# Patient Record
Sex: Male | Born: 1950 | Race: White | Hispanic: No | Marital: Married | Smoking: Former smoker
Health system: Southern US, Community
[De-identification: ages and names within clinical notes are randomized; demographics above are authoritative.]

## PROBLEM LIST (undated history)

## (undated) DIAGNOSIS — I10 Essential (primary) hypertension: Secondary | ICD-10-CM

---

## 1997-06-14 ENCOUNTER — Emergency Department (HOSPITAL_COMMUNITY): Admission: EM | Admit: 1997-06-14 | Discharge: 1997-06-14 | Payer: Self-pay | Admitting: Emergency Medicine

## 1997-06-15 ENCOUNTER — Ambulatory Visit (HOSPITAL_COMMUNITY): Admission: RE | Admit: 1997-06-15 | Discharge: 1997-06-15 | Payer: Self-pay | Admitting: Emergency Medicine

## 1997-07-10 ENCOUNTER — Ambulatory Visit (HOSPITAL_COMMUNITY): Admission: RE | Admit: 1997-07-10 | Discharge: 1997-07-10 | Payer: Self-pay | Admitting: Gastroenterology

## 2004-11-10 ENCOUNTER — Emergency Department (HOSPITAL_COMMUNITY): Admission: EM | Admit: 2004-11-10 | Discharge: 2004-11-10 | Payer: Self-pay | Admitting: Emergency Medicine

## 2007-06-16 ENCOUNTER — Encounter: Admission: RE | Admit: 2007-06-16 | Discharge: 2007-06-16 | Payer: Self-pay | Admitting: Internal Medicine

## 2008-03-15 ENCOUNTER — Encounter: Admission: RE | Admit: 2008-03-15 | Discharge: 2008-03-15 | Payer: Self-pay | Admitting: Internal Medicine

## 2008-03-20 ENCOUNTER — Encounter: Admission: RE | Admit: 2008-03-20 | Discharge: 2008-03-20 | Payer: Self-pay | Admitting: Internal Medicine

## 2008-07-23 ENCOUNTER — Encounter: Admission: RE | Admit: 2008-07-23 | Discharge: 2008-07-23 | Payer: Self-pay | Admitting: Internal Medicine

## 2009-11-15 ENCOUNTER — Encounter: Admission: RE | Admit: 2009-11-15 | Discharge: 2009-11-15 | Payer: Self-pay | Admitting: Internal Medicine

## 2010-02-03 ENCOUNTER — Encounter: Payer: Self-pay | Admitting: Internal Medicine

## 2010-11-05 ENCOUNTER — Other Ambulatory Visit: Payer: Self-pay | Admitting: Internal Medicine

## 2010-11-05 DIAGNOSIS — R911 Solitary pulmonary nodule: Secondary | ICD-10-CM

## 2010-11-12 ENCOUNTER — Other Ambulatory Visit: Payer: Self-pay

## 2010-12-08 ENCOUNTER — Inpatient Hospital Stay
Admission: RE | Admit: 2010-12-08 | Discharge: 2010-12-08 | Payer: Self-pay | Source: Ambulatory Visit | Attending: Internal Medicine | Admitting: Internal Medicine

## 2011-08-24 ENCOUNTER — Other Ambulatory Visit: Payer: Self-pay | Admitting: Cardiology

## 2011-08-24 ENCOUNTER — Ambulatory Visit
Admission: RE | Admit: 2011-08-24 | Discharge: 2011-08-24 | Disposition: A | Discharge status: Home / Self Care | Payer: BC Managed Care – PPO | Source: Ambulatory Visit | Attending: Cardiology | Admitting: Cardiology

## 2011-08-24 DIAGNOSIS — Z0000 Encounter for general adult medical examination without abnormal findings: Secondary | ICD-10-CM

## 2015-05-10 ENCOUNTER — Other Ambulatory Visit: Payer: Self-pay | Admitting: Internal Medicine

## 2015-05-10 DIAGNOSIS — Z139 Encounter for screening, unspecified: Secondary | ICD-10-CM

## 2015-05-24 ENCOUNTER — Ambulatory Visit
Admission: RE | Admit: 2015-05-24 | Discharge: 2015-05-24 | Disposition: A | Discharge status: Home / Self Care | Payer: Commercial Managed Care - PPO | Source: Ambulatory Visit | Attending: Internal Medicine | Admitting: Internal Medicine

## 2015-05-24 DIAGNOSIS — Z139 Encounter for screening, unspecified: Secondary | ICD-10-CM

## 2017-07-24 ENCOUNTER — Emergency Department (HOSPITAL_COMMUNITY)
Admission: EM | Admit: 2017-07-24 | Discharge: 2017-07-24 | Disposition: A | Discharge status: Home / Self Care | Payer: Commercial Managed Care - PPO | Attending: Emergency Medicine | Admitting: Emergency Medicine

## 2017-07-24 ENCOUNTER — Encounter (HOSPITAL_COMMUNITY): Payer: Self-pay | Admitting: Emergency Medicine

## 2017-07-24 ENCOUNTER — Emergency Department (HOSPITAL_COMMUNITY): Payer: Commercial Managed Care - PPO

## 2017-07-24 ENCOUNTER — Other Ambulatory Visit: Payer: Self-pay

## 2017-07-24 DIAGNOSIS — R55 Syncope and collapse: Secondary | ICD-10-CM | POA: Diagnosis present

## 2017-07-24 DIAGNOSIS — I10 Essential (primary) hypertension: Secondary | ICD-10-CM | POA: Diagnosis not present

## 2017-07-24 HISTORY — DX: Essential (primary) hypertension: I10

## 2017-07-24 LAB — CBC
HCT: 43.9 % (ref 39.0–52.0)
Hemoglobin: 15 g/dL (ref 13.0–17.0)
MCH: 30.4 pg (ref 26.0–34.0)
MCHC: 34.2 g/dL (ref 30.0–36.0)
MCV: 89 fL (ref 78.0–100.0)
Platelets: 154 10*3/uL (ref 150–400)
RBC: 4.93 MIL/uL (ref 4.22–5.81)
RDW: 12.7 % (ref 11.5–15.5)
WBC: 6.8 10*3/uL (ref 4.0–10.5)

## 2017-07-24 LAB — BASIC METABOLIC PANEL
Anion gap: 9 (ref 5–15)
BUN: 17 mg/dL (ref 8–23)
CO2: 26 mmol/L (ref 22–32)
Calcium: 9.1 mg/dL (ref 8.9–10.3)
Chloride: 103 mmol/L (ref 98–111)
Creatinine, Ser: 1.1 mg/dL (ref 0.61–1.24)
GFR calc Af Amer: >60 mL/min (ref >60)
GFR calc non Af Amer: >60 mL/min (ref >60)
Glucose, Bld: 104 mg/dL — ABNORMAL HIGH (ref 70–99)
Potassium: 3.7 mmol/L (ref 3.5–5.1)
Sodium: 138 mmol/L (ref 135–145)

## 2017-07-24 LAB — CBG MONITORING, ED: Glucose-Capillary: 109 mg/dL — ABNORMAL HIGH (ref 70–99)

## 2017-07-24 NOTE — ED Notes (Signed)
Patient left at this time with all belongings. 

## 2017-07-24 NOTE — ED Notes (Signed)
ED Provider at bedside. 

## 2017-07-24 NOTE — Discharge Instructions (Addendum)
Hold your chlorthalidone until followed up by your primary doctor.  Rest and increase your fluid intake for the next 2 days.  Return to the emergency department if symptoms significantly worsen or change.

## 2017-07-24 NOTE — ED Triage Notes (Signed)
Pt had one episode of syncope, falling to the floor after he urinated.  Pt fell and hit the front of his face, wife stated it took him a minute or two to wake up and recognize her.  Recent change to bp medications.  Was feeling nausea however given 4mg  of Zofran by EMS which relieved the symptom.

## 2017-07-24 NOTE — ED Provider Notes (Signed)
[facility]   CSN: [id number] Arrival date & time: 07/24/17  0125     History   Chief Complaint Chief Complaint  Patient presents with  . Loss of Consciousness    HPI Ian Powell is a 67 y.o. male.  This patient is a 67 year old male with past medical history of hypertension.  He presents today for evaluation of a syncopal episode.  This evening, he woke to go to the bathroom.  While he was standing and attempting to urinate, he suddenly became very dizzy, lightheaded, and broke out in a sweat.  He then took several steps back toward the bedroom to ask his wife for help when he then fell to the floor and had a brief episode of unresponsiveness.  He woke up less than a minute later confused and somewhat disoriented.  There was no seizure-like activity and no loss of bowel or bladder function.  He now feels fine.  He was restarted on his blood pressure medicine chlorthalidone approximately 1 week ago and reports feeling somewhat dizzy since starting this medication.  He spoke with his primary doctor today who told him to discontinue the medication.  The history is provided by the patient.  Loss of Consciousness   This is a new problem. The current episode started less than 1 hour ago. The problem occurs constantly. The problem has been resolved. He lost consciousness for a period of less than one minute. Associated with: Urinating. Pertinent negatives include chest pain, fever, headaches and slurred speech. He has tried nothing for the symptoms. The treatment provided no relief.    Past Medical History:  Diagnosis Date  . Hypertension     There are no active problems to display for this patient.   History reviewed. No pertinent surgical history.      Home Medications    Prior to Admission medications   Not on File    Family History No family history on file.  Social History Social History   Tobacco Use  .  Smoking status: Not on file  Substance Use Topics  . Alcohol use: Not on file  . Drug use: Not on file     Allergies   Penicillins   Review of Systems Review of Systems  Constitutional: Negative for fever.  Cardiovascular: Positive for syncope. Negative for chest pain.  Neurological: Negative for headaches.  All other systems reviewed and are negative.    Physical Exam Updated Vital Signs BP 118/71 (BP Location: Right Arm)   Pulse 60   Temp 97.9 F (36.6 C) (Oral)   Resp 15   Ht 5\' 10"  (1.778 m)   Wt 89.8 kg (198 lb)   SpO2 96%   BMI 28.41 kg/m   Physical Exam  Constitutional: He is oriented to person, place, and time. He appears well-developed and well-nourished. No distress.  HENT:  Head: Normocephalic and atraumatic.  Mouth/Throat: Oropharynx is clear and moist.  Eyes: Pupils are equal, round, and reactive to light. EOM are normal.  Neck: Normal range of motion. Neck supple.  Cardiovascular: Normal rate and regular rhythm. Exam reveals no friction rub.  No murmur heard. Pulmonary/Chest: Effort normal and breath sounds normal. No respiratory distress. He has no wheezes. He has no rales.  Abdominal: Soft. Bowel sounds are normal. He exhibits no distension. There is no tenderness.  Musculoskeletal: Normal range of motion. He exhibits no edema.  Neurological: He is alert and oriented to person, place, and time. No  cranial nerve deficit. He exhibits normal muscle tone. Coordination normal.  Skin: Skin is warm and dry. He is not diaphoretic.  Nursing note and vitals reviewed.    ED Treatments / Results  Labs (all labs ordered are listed, but only abnormal results are displayed) Labs Reviewed  CBG MONITORING, ED - Abnormal; Notable for the following components:      Result Value   Glucose-Capillary 109 (*)    All other components within normal limits  CBC  BASIC METABOLIC PANEL  URINALYSIS, ROUTINE W REFLEX MICROSCOPIC    EKG EKG  Interpretation  Date/Time:  Saturday July 24 2017 01:53:36 EDT Ventricular Rate:  62 PR Interval:    QRS Duration: 91 QT Interval:  431 QTC Calculation: 438 R Axis:     Text Interpretation:  Sinus rhythm Nonspecific T wave abnormality Prolonged QT No signficant change from 11/10/2004 Confirmed by Geoffery Lyons ([id number]) on 07/24/2017 2:12:19 AM   Radiology No results found.  Procedures Procedures (including critical care time)  Medications Ordered in ED Medications - No data to display   Initial Impression / Assessment and Plan / ED Course  I have reviewed the triage vital signs and the nursing notes.  Pertinent labs & imaging results that were available during my care of the patient were reviewed by me and considered in my medical decision making (see chart for details).  Patient presents here after a syncopal episode that occurred while urinating.  He was also recently started on a new blood pressure medication.  I am uncertain as to the exact etiology of the syncope, however his work-up is unremarkable.  Based on the history, I suspect micturition induced.  At this point, I feel as though he is appropriate for discharge, to follow-up as needed.  Final Clinical Impressions(s) / ED Diagnoses   Final diagnoses:  None    ED Discharge Orders    None       Geoffery Lyons, MD 07/24/17 [phone]

## 2019-07-24 ENCOUNTER — Other Ambulatory Visit: Payer: Self-pay

## 2019-07-24 ENCOUNTER — Encounter (HOSPITAL_COMMUNITY): Payer: Self-pay | Admitting: Emergency Medicine

## 2019-07-24 ENCOUNTER — Emergency Department (HOSPITAL_COMMUNITY): Payer: Commercial Managed Care - PPO

## 2019-07-24 ENCOUNTER — Emergency Department (HOSPITAL_COMMUNITY)
Admission: EM | Admit: 2019-07-24 | Discharge: 2019-07-24 | Disposition: A | Discharge status: Home / Self Care | Payer: Commercial Managed Care - PPO | Attending: Emergency Medicine | Admitting: Emergency Medicine

## 2019-07-24 DIAGNOSIS — Z5321 Procedure and treatment not carried out due to patient leaving prior to being seen by health care provider: Secondary | ICD-10-CM | POA: Insufficient documentation

## 2019-07-24 DIAGNOSIS — R0789 Other chest pain: Secondary | ICD-10-CM | POA: Diagnosis not present

## 2019-07-24 LAB — BASIC METABOLIC PANEL
Anion gap: 11 (ref 5–15)
BUN: 14 mg/dL (ref 8–23)
CO2: 25 mmol/L (ref 22–32)
Calcium: 9.4 mg/dL (ref 8.9–10.3)
Chloride: 102 mmol/L (ref 98–111)
Creatinine, Ser: 1.02 mg/dL (ref 0.61–1.24)
GFR calc Af Amer: >60 mL/min (ref >60)
GFR calc non Af Amer: >60 mL/min (ref >60)
Glucose, Bld: 84 mg/dL (ref 70–99)
Potassium: 3.9 mmol/L (ref 3.5–5.1)
Sodium: 138 mmol/L (ref 135–145)

## 2019-07-24 LAB — CBC
HCT: 43.5 % (ref 39.0–52.0)
Hemoglobin: 14.6 g/dL (ref 13.0–17.0)
MCH: 30.5 pg (ref 26.0–34.0)
MCHC: 33.6 g/dL (ref 30.0–36.0)
MCV: 90.8 fL (ref 80.0–100.0)
Platelets: 195 K/uL (ref 150–400)
RBC: 4.79 MIL/uL (ref 4.22–5.81)
RDW: 13.1 % (ref 11.5–15.5)
WBC: 7.6 K/uL (ref 4.0–10.5)
nRBC: 0 % (ref 0.0–0.2)

## 2019-07-24 LAB — TROPONIN I (HIGH SENSITIVITY)
Troponin I (High Sensitivity): 4 ng/L
Troponin I (High Sensitivity): 4 ng/L

## 2019-07-24 MED ORDER — SODIUM CHLORIDE 0.9% FLUSH: 3.0000 mL | Freq: Once | INTRAVENOUS | Status: DC

## 2019-07-24 NOTE — ED Triage Notes (Addendum)
Pt c/o mid chest tightness for the past 3 days getting worse, pt denies SOB, nausea, vomiting or dizziness, pt states even walking his dog make the pain increase.

## 2019-11-10 ENCOUNTER — Ambulatory Visit: Payer: Commercial Managed Care - PPO | Admitting: Family Medicine

## 2019-11-10 ENCOUNTER — Encounter: Payer: Self-pay | Admitting: Family Medicine

## 2019-11-10 ENCOUNTER — Ambulatory Visit: Payer: Self-pay

## 2019-11-10 ENCOUNTER — Other Ambulatory Visit: Payer: Self-pay

## 2019-11-10 VITALS — BP 136/78 | HR 60 | Ht 70.0 in | Wt 187.0 lb

## 2019-11-10 DIAGNOSIS — M84351A Stress fracture, right femur, initial encounter for fracture: Secondary | ICD-10-CM | POA: Diagnosis not present

## 2019-11-10 DIAGNOSIS — M25561 Pain in right knee: Secondary | ICD-10-CM | POA: Diagnosis not present

## 2019-11-10 NOTE — Progress Notes (Signed)
Subjective:    CC: R ant knee pain  I, Molly Weber, LAT, ATC, am serving as scribe for Dr. [name].  HPI: Pt is a 69 y/o male presenting w/ c/o R ant knee pain x 3 weeks when he was running downhill and experienced sharp R knee pain.  He runs 4-5 miles/day and runs 4-5 days/week.  Developed pain after running downhill.  Seen at [facility] when he 2 weeks ago where x-ray showed reportedly some DJD and was given intra-articular steroid injection with little benefit.  Pain is worse at medial knee.  R knee swelling: no R knee mechanical symptoms: no Aggravating factors: running; transitioning from sit-to-stand; first few steps when he first stands from sitting; ascending stairs; walking downhill Treatments tried: cortisone injection 2 weeks ago ; ice; IBU  Diagnostic imaging: R knee XR at [facility] 2 weeks ago  Pertinent review of Systems: No fevers or chills  Relevant historical information: Hypertension   Objective:    Vitals:   11/10/19 0844  BP: 136/78  Pulse: 60  SpO2: 98%   General: Well Developed, well nourished, and in no acute distress.   MSK: Right knee normal-appearing Normal motion with minimal crepitation. Tender palpation significantly at medial femoral condyle just proximal of medial joint line. Stable ligamentous exam and negative McMurray's test somewhat limited by guarding. Intact strength.  Lab and Radiology Results Diagnostic Limited MSK Ultrasound of: Right knee Quad tendon intact normal-appearing Trace joint effusion superior patellar space. Patellar tendon intact normal. Trace hypoechoic fluid tracking superficial to patellar tendon subcutaneous space consistent with mild prepatellar bursitis. Lateral joint line normal. Medial joint line narrowed with degenerative appearing partially extruded medial meniscus. Area of maximum tenderness visualized at medial femoral condyle. Defect in bone cortex visible with increased vascular activity  consistent with stress fracture versus tiny cortical fracture Impression: Concern for femoral condyle fracture versus stress fracture and DJD, degenerative medial meniscus, prepatellar bursitis    Impression and Recommendations:    Assessment and Plan: 69 y.o. male with right knee pain occurring while running downhill about 3 weeks ago.Cathlean Sauer at outside location did not show fracture and patient did not have significant benefit from steroid injection.  Based on ultrasound examination today very concerned for femoral condyle fracture versus stress fracture.  Plan for knee brace, limited weightbearing with crutches, and MRI.  Recheck following MRI.  PDMP not reviewed this encounter. Orders Placed This Encounter  Procedures  . [place] LIMITED JOINT SPACE STRUCTURES LOW RIGHT(NO LINKED CHARGES)    Order Specific Question:   Reason for Exam (SYMPTOM  OR DIAGNOSIS REQUIRED)    Answer:   R knee pain    Order Specific Question:   Preferred imaging location?    Answer:   [facility]  . MR Knee Right Wo Contrast    Standing Status:   Future    Standing Expiration Date:   11/09/2020    Order Specific Question:   What is the patient's sedation requirement?    Answer:   No Sedation    Order Specific Question:   Does the patient have a pacemaker or implanted devices?    Answer:   No    Order Specific Question:   Preferred imaging location?    Answer:   [facility] (table limit-350lbs)   No orders of the defined types were placed in this encounter.   Discussed warning signs or symptoms. Please see discharge instructions. Patient expresses understanding.   The above  documentation has been reviewed and is accurate and complete Lynne Leader, M.D.

## 2019-11-10 NOTE — Patient Instructions (Addendum)
Thank you for coming in today.  Hinged knee brace.  Crutches with limited weight bearing.  Plan for MRI.  Recheck following MRI.     Stress Fracture  A stress fracture is a small break or crack in a bone. A stress fracture can be fully broken (complete) or partially broken (incomplete). The most common sites for stress fractures are the bones in the front of your feet (metatarsals), your heel (calcaneus), and the long bone of your lower leg (tibia). What are the causes? This condition is caused by overuse or repetitive exercise, such as running. It happens when a bone cannot absorb any more shock because the muscles around it are weak. Stress fractures happen most commonly when:  You rapidly increase or start a new physical activity.  You use shoes that are worn out or do not fit properly.  You exercise on a new surface. What increases the risk? You are more likely to develop this condition if:  You have a condition that causes weak bones (osteoporosis).  You are male. Stress fractures are more likely to occur in women. What are the signs or symptoms? The most common symptom of a stress fracture is feeling pain when you are using or putting weight on the affected part of your body. The pain usually improves when you are resting. Other symptoms may include:  Swelling of the affected area.  Pain in the area when it is touched. Stress fracture pain usually develops over time. How is this diagnosed? This condition may be diagnosed by:  Your symptoms.  Your medical history.  A physical exam.  Imaging tests, such as: ? X-rays. ? MRI. ? Bone scan. How is this treated? Treatment depends on the severity of your stress fracture. It is commonly treated with resting, icing, compression, and elevation (RICE therapy). Treatment may also include:  Medicines to reduce inflammation.  A cast or a walking shoe.  Crutches.  Surgery. This is usually only in severe cases. Follow  these instructions at home: If you have a cast:  Do not put pressure on any part of the cast until it is fully hardened. This may take several hours.  Do not stick anything inside the cast to scratch your skin. Doing that increases your risk of infection.  Check the skin around the cast every day. Tell your health care provider about any concerns.  You may put lotion on dry skin around the edges of the cast. Do not put lotion on the skin underneath the cast.  Keep the cast clean.  If the cast is not waterproof: ? Do not let it get wet. ? Cover it with a watertight covering when you take a bath or a shower. If you have a walking shoe:  Wear the shoe as told by your health care provider. Remove it only as told by your health care provider.  Loosen the shoe if your toes tingle, become numb, or turn cold and blue.  Keep the shoe clean.  If the shoe is not waterproof: ? Do not let it get wet. Managing pain, stiffness, and swelling   If directed, apply ice to the injured area: ? If you have a walking shoe, remove the shoe as told by your health care provider. ? Put ice in a plastic bag. ? Place a towel between your skin and the bag or between your cast and the bag. ? Leave the ice on for 20 minutes, 2-3 times per day.  Move your toes often to  avoid stiffness and to lessen swelling.  Raise (elevate) the injured area above the level of your heart while you are sitting or lying down. Activity  Rest as directed by your health care provider. Ask your health care provider if you may do alternative exercises, such as swimming or biking, while you are healing.  Return to your normal activities as directed by your health care provider. Ask your health care provider what activities are safe for you.  Perform range-of-motion exercises only as directed by your health care provider. General instructions  Do not use the injured limb to support yourbody weight until your health care  provider says that you can. Use crutches if your health care provider tells you to do so.  Do not use any products that contain nicotine or tobacco, such as cigarettes and e-cigarettes. These can delay bone healing. If you need help quitting, ask your health care provider.  Take over-the-counter and prescription medicines only as told by your health care provider.  Keep all follow-up visits as told by your health care provider. This is important. How is this prevented?  Only wear shoes that: ? Fit well. ? Are not worn out.  Eat a healthy diet that contains vitamin D and calcium. This helps keep your bones strong. Good sources of calcium and vitamin D include: ? Low-fat dairy products such as milk, yogurt, and cheese. ? Certain fish, such as fresh or canned salmon, tuna, and sardines. ? Products that have calcium and vitamin D added to them (fortified products), such as fortified cereals or juice.  Be careful when you start a new physical activity. Give your body time to adjust.  Avoid doing only one kind of activity. Do different exercises, such as swimming and running, so that no single part of your body gets overused.  Do strength-training exercises. Contact a health care provider if:  Your pain gets worse.  You have new symptoms.  You have increased swelling. Get help right away if:  You lose feeling in the injured area. Summary  A stress fracture is a small break or crack in a bone. A stress fracture can be fully broken (complete) or partially broken (incomplete).  This condition is caused by overuse or repetitive exercise, such as running.  The most common symptom of a stress fracture is feeling pain when you are using or putting weight on the affected part of your body.  Treatment depends on the severity of your stress fracture. This information is not intended to replace advice given to you by your health care provider. Make sure you discuss any questions you have  with your health care provider. Document Revised: 02/09/2017 Document Reviewed: 02/09/2017 Elsevier Patient Education  2020 [employer].

## 2019-11-19 ENCOUNTER — Other Ambulatory Visit: Payer: Self-pay

## 2019-11-19 ENCOUNTER — Ambulatory Visit (INDEPENDENT_AMBULATORY_CARE_PROVIDER_SITE_OTHER): Payer: Commercial Managed Care - PPO

## 2019-11-19 DIAGNOSIS — Y9302 Activity, running: Secondary | ICD-10-CM | POA: Diagnosis not present

## 2019-11-19 DIAGNOSIS — M84351A Stress fracture, right femur, initial encounter for fracture: Secondary | ICD-10-CM

## 2019-11-19 DIAGNOSIS — M25561 Pain in right knee: Secondary | ICD-10-CM

## 2019-11-20 NOTE — Progress Notes (Signed)
MRI does show a nondepressed nondisplaced insufficiency fracture of the femur at the area of pain on ultrasound.  It also shows a meniscus tear and some arthritis.  Continue current treatment as we discussed the other day in clinic and schedule follow-up appoint with me in the near future to go over the results of full detail.

## 2019-11-23 NOTE — Progress Notes (Signed)
I, Christoper Fabian, LAT, ATC, am serving as scribe for Dr. [name].  Ian Powell is a 69 y.o. male who presents to [facility] today for f/u of R medial knee / distal thigh pain and R knee MRI review.  He was last seen by Dr. Denyse Amass on 11/10/19 for a 2nd opinion after having been to [facility] and had a corticosteroid injection in his R knee w/ little benefit.  Due to concern for a possible stress fracture, pt was placed in a hinged knee brace and given crutches.  Since his last visit, pt reports that his R medial knee pain remains unchanged / slightly worse.  He doesn't seem to have much swelling per pt report.  Pt has been wearing his hinged knee brace and ambulating w/ one axillary crutch.  Diagnostic testing: R knee MRI- 11/19/19   Pertinent review of systems: No fevers or chills  Relevant historical information: No diagnosed history of osteoporosis or decreased bone mineral density   Exam:  BP 132/80 (BP Location: Left Arm, Patient Position: Sitting, Cuff Size: Normal)   Pulse 64   Ht 5\' 10"  (1.778 m)   Wt 186 lb 6.4 oz (84.6 kg)   SpO2 97%   BMI 26.75 kg/m  General: Well Developed, well nourished, and in no acute distress.   MSK: Right knee normal motion    Lab and Radiology Results EXAM: MRI OF THE RIGHT KNEE WITHOUT CONTRAST  TECHNIQUE: Multiplanar, multisequence MR imaging of the knee was performed. No intravenous contrast was administered.  COMPARISON:  None.  FINDINGS: MENISCI  Medial: Radial tear of the posterior horn of the medial meniscus extending into the posterior body.  Lateral: Intact.  LIGAMENTS  Cruciates: ACL and PCL are intact.  Collaterals: Medial collateral ligament is intact. Lateral collateral ligament complex is intact.  CARTILAGE  Patellofemoral: Partial-thickness cartilage loss of the patella with subchondral marrow edema in the patellar apex. Partial-thickness cartilage loss of the  trochlear groove.  Medial: High-grade partial-thickness cartilage loss of the medial femorotibial compartment with areas of full-thickness cartilage loss of the medial femoral condyle. Subchondral reactive marrow edema.  Lateral: Partial thickness cartilage loss of the lateral femorotibial compartment.  JOINT: Small joint effusion. Normal Hoffa's fat-pad. No plical thickening.  POPLITEAL FOSSA: Popliteus tendon is intact. Small Baker's cyst.  EXTENSOR MECHANISM: Intact quadriceps tendon. Intact patellar tendon. Intact lateral patellar retinaculum. Intact medial patellar retinaculum. Intact MPFL.  BONES: Subchondral linear low signal in the medial femoral condyle with severe surrounding marrow edema most concerning for a nondepressed, nondisplaced subchondral insufficiency fracture. Subchondral marrow edema in the periphery of the medial tibial plateau may reflect stress reaction versus reactive changes secondary to overlying cartilage loss and meniscal pathology.  Other: No fluid collection or hematoma. Muscles are normal.  IMPRESSION: 1. Radial tear of the posterior horn of the medial meniscus extending into the posterior body. 2. Nondepressed, nondisplaced subchondral insufficiency fracture of the medial femoral condyle with severe surrounding marrow edema. 3. Subchondral marrow edema in the periphery of the medial tibial plateau may reflect stress reaction versus reactive changes secondary to overlying cartilage loss and meniscal pathology. 4. Tricompartmental cartilage abnormalities as described above.   Electronically Signed   By:   On: 11/20/2019 08:40 I, 13/08/2019, personally (independently) visualized and performed the interpretation of the images attached in this note. Significant bone anemia medial femoral condyle and medial tibia plateau with insufficiency fracture without depression or displacement at medial  femoral condyle.  Posterior  horn medial meniscus tear as well.   Assessment and Plan: 69 y.o. male with knee pain secondary to insufficiency fracture of the medial femoral condyle.  This is thought to be due to excessive impacted due to some chondromalacia as well as posterior horn medial meniscus tear.  However we will double check to make sure he does not have significant bone marrow deficiency or osteoporosis or osteopenia by obtaining a DEXA scan. In the meantime recommend offloading.  Continue hinged knee brace and offloading to nonweightbearing or limited touchdown weightbearing.  Recheck in 1 month.  Will preserve quad strength by home exercise program including straight leg raises.  Recommend adding vitamin D   PDMP not reviewed this encounter. Orders Placed This Encounter  Procedures  . DG Bone Density    Standing Status:   Future    Standing Expiration Date:   11/23/2020    Order Specific Question:   Reason for Exam (SYMPTOM  OR DIAGNOSIS REQUIRED)    Answer:   insufficiency fracture    Order Specific Question:   Preferred imaging location?    Answer:   [address]   No orders of the defined types were placed in this encounter.    Discussed warning signs or symptoms. Please see discharge instructions. Patient expresses understanding.   The above documentation has been reviewed and is accurate and complete [name], M.D.  Total encounter time 30 minutes including face-to-face time with the patient and, reviewing past medical record, and charting on the date of service.   Discussed MRI findings and treatment plan options and next steps.  Also discussed return to running program.

## 2019-11-24 ENCOUNTER — Encounter: Payer: Self-pay | Admitting: Family Medicine

## 2019-11-24 ENCOUNTER — Other Ambulatory Visit: Payer: Self-pay

## 2019-11-24 ENCOUNTER — Ambulatory Visit: Payer: Commercial Managed Care - PPO | Admitting: Family Medicine

## 2019-11-24 ENCOUNTER — Ambulatory Visit (INDEPENDENT_AMBULATORY_CARE_PROVIDER_SITE_OTHER)
Admission: RE | Admit: 2019-11-24 | Discharge: 2019-11-24 | Disposition: A | Discharge status: Home / Self Care | Payer: Commercial Managed Care - PPO | Source: Ambulatory Visit | Attending: Family Medicine | Admitting: Family Medicine

## 2019-11-24 VITALS — BP 132/80 | HR 64 | Ht 70.0 in | Wt 186.4 lb

## 2019-11-24 DIAGNOSIS — M8580 Other specified disorders of bone density and structure, unspecified site: Secondary | ICD-10-CM

## 2019-11-24 DIAGNOSIS — M84451A Pathological fracture, right femur, initial encounter for fracture: Secondary | ICD-10-CM | POA: Diagnosis not present

## 2019-11-24 DIAGNOSIS — M859 Disorder of bone density and structure, unspecified: Secondary | ICD-10-CM

## 2019-11-24 NOTE — Patient Instructions (Signed)
Thank you for coming in today.  Plan for bone density test. You should hear soon about scheduling or the front desk can schedule.   Offload the knee. Use two crutches if needed to reduce pain further. Less pressure to no pressure on the knee if better for now.   Vit D3 1000-5000 units daily.   Work on straight leg raises to keep the quad strong.   Recheck in 1 month.

## 2019-11-27 ENCOUNTER — Encounter: Payer: Self-pay | Admitting: Family Medicine

## 2019-11-27 NOTE — Progress Notes (Signed)
Bone density test shows low bone density that puts you in the osteopenia range.  However with the history of insufficiency fracture that puts you more at osteoporosis range.  I will send your primary care provider a letter.  It is reasonable to discuss with your primary care provider possibility of starting medications for osteopenia or osteoporosis.

## 2019-12-12 ENCOUNTER — Telehealth: Payer: Self-pay | Admitting: Family Medicine

## 2019-12-12 NOTE — Telephone Encounter (Signed)
Received notes from reviewing orthopedic.  Patient had visit on October 18, 2019.  X-ray showed mild to moderate degenerative changes but no fracture.  He was given a Depo-Medrol Marcaine injection.  Note will be sent to scan.

## 2019-12-14 NOTE — Progress Notes (Signed)
   I, Christoper Fabian, LAT, ATC, am serving as scribe for Dr. [name].  Ian Powell is a 69 y.o. male who presents to [facility] at [facility] today for f/u of his R medial knee/distal thigh pain due to a medial femoral condyle insufficiency fracture.  He was last seen by Dr. Denyse Amass on 11/24/19 and noted no change/slight worsening and con't to wear his hinged knee brace as suggested.  Pt was advised to transition to NWB status and to use B axillary crutches instead of one crutch as he had been doing.  He was also referred for a bone density test.  Since his last visit, pt reports that his R medial knee is feeling much better and notes virtually no pain since his last visit.  He is not reporting any issue w/ R knee AROM.  He has been consistently doing his HEP for quad strengthening.  Diagnostic testing: Bone density- 11/24/19; R knee MRI- 11/19/19  Pertinent review of systems: No fevers or chills  Relevant historical information: Hypertension   Exam:  BP 118/78 (BP Location: Left Arm, Patient Position: Sitting, Cuff Size: Normal)   Pulse 62   Ht 5\' 10"  (1.778 m)   Wt 189 lb 6.4 oz (85.9 kg)   SpO2 98%   BMI 27.18 kg/m  General: Well Developed, well nourished, and in no acute distress.   MSK: Right knee normal-appearing nontender normal motion.      Assessment and Plan: 69 y.o. male with right knee medial femoral condyle insufficiency fracture.  Clinically doing great with reduced weightbearing.  Plan to gradually increase weightbearing as tolerated over the next 3 weeks.  Start working on quad strengthening more thoroughly at the gym in about 2 or 3 weeks.  Hopefully will be able to get rid of crutches completely by the 3-week mark.  Plan to check back in about a month or so.  Plan to return to running in about 6 weeks.  Anticipate return to running program.  Also reviewed bone density.  Patient has low bone mineral density determined via DEXA scan after his last  visit.  It is debatable whether or not his fracture is a fragility fracture or just bad luck.  If we determine true fragility fracture he would meet criteria for osteoporosis.  However I do not think this is likely.  Plan to continue vitamin D.    Discussed warning signs or symptoms. Please see discharge instructions. Patient expresses understanding.   The above documentation has been reviewed and is accurate and complete 66, M.D.

## 2019-12-15 ENCOUNTER — Ambulatory Visit: Payer: Commercial Managed Care - PPO | Admitting: Family Medicine

## 2019-12-15 ENCOUNTER — Encounter: Payer: Self-pay | Admitting: Family Medicine

## 2019-12-15 ENCOUNTER — Other Ambulatory Visit: Payer: Self-pay

## 2019-12-15 VITALS — BP 118/78 | HR 62 | Ht 70.0 in | Wt 189.4 lb

## 2019-12-15 DIAGNOSIS — M84451D Pathological fracture, right femur, subsequent encounter for fracture with routine healing: Secondary | ICD-10-CM

## 2019-12-15 DIAGNOSIS — M84453A Pathological fracture, unspecified femur, initial encounter for fracture: Secondary | ICD-10-CM | POA: Insufficient documentation

## 2019-12-15 DIAGNOSIS — M8580 Other specified disorders of bone density and structure, unspecified site: Secondary | ICD-10-CM | POA: Diagnosis not present

## 2019-12-15 HISTORY — DX: Other specified disorders of bone density and structure, unspecified site: M85.80

## 2019-12-15 NOTE — Patient Instructions (Signed)
Thank you for coming in today.  Advance weightbearing as tolerated over the next 3 weeks or so.   Recheck in about 1 month.   After 2 weeks ok to increase quad strength in gym as tolerated.   Plan to return to running in something like 6 weeks.

## 2020-01-18 NOTE — Progress Notes (Signed)
° °  I, Christoper Fabian, LAT, ATC, am serving as scribe for Dr. [name].  Ian Powell is a 70 y.o. male who presents to [facility] at [facility] today for f/u of R medial femoral condyle insufficiency fracture / medial knee pain.  He was last seen by Dr. Denyse Amass on 12/15/19 and noted improvement in his symptoms.  He was advised to gradually progress his weight-bearing status w/ his crutches in hopes of d/c-ing his crutches by his next visit.  He was also advised to progress his quad strengthening exercises per Dr. Denyse Amass.  Since his last visit, pt reports that his R knee is doing much better and notes occasional soreness if he overdoes his activity.  He is no longer using crutches and d/c those the week of Christmas.  From an exercise standpoint, he con't his SLR and has started to do some leg press and 3/4 squats w/ a Smith machine and he has been walking about a half mile each day.  Insufficiency fracture was diagnosed 2 months ago with MRI.  Since then he has been initially offloaded and partially immobilized.  Subsequently he has been able to advance his activity.  He has not returned to running.  Prior to injury he was running 3 to 5 miles per day 5 days/week.  He also likes to do surfing and waterskiing.  He has been able to to start walking again walking almost a mile at a time and doing some very light weightlifting.  Diagnostic testing: Bone density test- 11/24/19; R knee MRI- 11/19/19   Pertinent review of systems: No fevers or chills  Relevant historical information: Hypertension   Exam:  BP 138/74 (BP Location: Right Arm, Patient Position: Sitting, Cuff Size: Normal)    Pulse 70    Ht 5\' 10"  (1.778 m)    Wt 194 lb 3.2 oz (88.1 kg)    SpO2 97%    BMI 27.86 kg/m  General: Well Developed, well nourished, and in no acute distress.   MSK: Right knee normal-appearing Normal motion. Nontender. Normal strength. Normal gait.    Assessment and Plan: 70 y.o. male with  right knee medial femoral condyle insufficiency fracture after running down a hill.  Patient clinically has done extremely well with 2 months of care at this point.  At this point I think it is okay for him to start advancing his activity but not yet returned to running.  Spent a lot of time about return to activity program and protocol.  Discussed activity as directed by pain and threshold of pain.  Recommend starting return to running trial in 1 month (3 months after injury) with a starting at about 50% preinjury workload and advancing 10 %/week. Additionally advised him that he does have degenerative changes on knee MRI and that it is likely that he is going to outlive his knee and ultimately he probably will need a knee replacement.  Recheck back with me in 1 to 3 months if needed.  Otherwise okay to not return to clinic if doing well.     Discussed warning signs or symptoms. Please see discharge instructions. Patient expresses understanding.   The above documentation has been reviewed and is accurate and complete 66, M.D.    Total encounter time 20 minutes including face-to-face time with the patient and, reviewing past medical record, and charting on the date of service.   Return to activity discussion

## 2020-01-19 ENCOUNTER — Encounter: Payer: Self-pay | Admitting: Family Medicine

## 2020-01-19 ENCOUNTER — Ambulatory Visit: Payer: Commercial Managed Care - PPO | Admitting: Family Medicine

## 2020-01-19 ENCOUNTER — Other Ambulatory Visit: Payer: Self-pay

## 2020-01-19 VITALS — BP 138/74 | HR 70 | Ht 70.0 in | Wt 194.2 lb

## 2020-01-19 DIAGNOSIS — M8580 Other specified disorders of bone density and structure, unspecified site: Secondary | ICD-10-CM

## 2020-01-19 DIAGNOSIS — M84451D Pathological fracture, right femur, subsequent encounter for fracture with routine healing: Secondary | ICD-10-CM

## 2020-01-19 DIAGNOSIS — I10 Essential (primary) hypertension: Secondary | ICD-10-CM | POA: Insufficient documentation

## 2020-01-19 NOTE — Patient Instructions (Signed)
Thank you for coming in today.  Ok to increase activity walking and non impact loading of the knee.   Ok to start a return to running program in about 1 month.   Recommend starting at about 50% of pre-injury work load and advance about 10% per week.  Adjust based on pain   Generally I recommend avoiding heavy squats beyond 70 deg knee flexion.

## 2020-04-18 NOTE — Progress Notes (Signed)
I, Ian Powell, LAT, ATC acting as a scribe for Ian Graham, MD.  Ian Powell is a 70 y.o. male who presents to [facility] at [facility] today for f/u of R knee pain/ medial femoral insufficiency fracture.  He was last seen by Dr. Denyse Amass on 01/19/20 for f/u and noted con't improvement in his pain.  He had resumed walking for exercise and was doing squats w/ the [employer].  He was advised to begin a return to run program in early Feb.  Since his last visit, pt reports knee is doing well. Pt reports some pain under patella, but relates that to the arthritis. Pt reports he is back to activity, more walking than running.  Currently running 2 to 3 miles at a time 3 days a week.  Goal is to run 5 miles 5 days a week.  Diagnostic testing: Bone density test- 11/24/19; R knee MRI- 11/19/19   Pertinent review of systems: No fevers or chills  Relevant historical information: Hypertension   Exam:  BP 130/86 (BP Location: Right Arm, Patient Position: Sitting, Cuff Size: Normal)   Pulse 64   Ht 5\' 10"  (1.778 m)   Wt 196 lb 9.6 oz (89.2 kg)   SpO2 99%   BMI 28.21 kg/m  General: Well Developed, well nourished, and in no acute distress.   MSK: Right knee normal-appearing nontender normal motion with minimal crepitation intact strength.    Lab and Radiology Results  EXAM: MRI OF THE RIGHT KNEE WITHOUT CONTRAST  TECHNIQUE: Multiplanar, multisequence MR imaging of the knee was performed. No intravenous contrast was administered.  COMPARISON:  None.  FINDINGS: MENISCI  Medial: Radial tear of the posterior horn of the medial meniscus extending into the posterior body.  Lateral: Intact.  LIGAMENTS  Cruciates: ACL and PCL are intact.  Collaterals: Medial collateral ligament is intact. Lateral collateral ligament complex is intact.  CARTILAGE  Patellofemoral: Partial-thickness cartilage loss of the patella with subchondral marrow edema in the  patellar apex. Partial-thickness cartilage loss of the trochlear groove.  Medial: High-grade partial-thickness cartilage loss of the medial femorotibial compartment with areas of full-thickness cartilage loss of the medial femoral condyle. Subchondral reactive marrow edema.  Lateral: Partial thickness cartilage loss of the lateral femorotibial compartment.  JOINT: Small joint effusion. Normal Hoffa's fat-pad. No plical thickening.  POPLITEAL FOSSA: Popliteus tendon is intact. Small Baker's cyst.  EXTENSOR MECHANISM: Intact quadriceps tendon. Intact patellar tendon. Intact lateral patellar retinaculum. Intact medial patellar retinaculum. Intact MPFL.  BONES: Subchondral linear low signal in the medial femoral condyle with severe surrounding marrow edema most concerning for a nondepressed, nondisplaced subchondral insufficiency fracture. Subchondral marrow edema in the periphery of the medial tibial plateau may reflect stress reaction versus reactive changes secondary to overlying cartilage loss and meniscal pathology.  Other: No fluid collection or hematoma. Muscles are normal.  IMPRESSION: 1. Radial tear of the posterior horn of the medial meniscus extending into the posterior body. 2. Nondepressed, nondisplaced subchondral insufficiency fracture of the medial femoral condyle with severe surrounding marrow edema. 3. Subchondral marrow edema in the periphery of the medial tibial plateau may reflect stress reaction versus reactive changes secondary to overlying cartilage loss and meniscal pathology. 4. Tricompartmental cartilage abnormalities as described above.   Electronically Signed   By:   On: 11/20/2019 08:40  I, 13/08/2019, personally (independently) visualized and performed the interpretation of the images attached in this note.     Assessment and Plan: 69  y.o. male with right knee pain.  Overall doing really well.  Insufficiency fracture  at this point has effectively healed.  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  He is doing the conservative management strategies that are the best evidence-based for managing knee pain due to DJD including quad strengthening and weight loss/weight management.  Also recommend Voltaren gel and compressive knee sleeve.  Advance running as tolerated.  Discussed options.  Could consider hyaluronic acid injections in the future.  Provided him a handout he will think about it and let me know.  Otherwise recheck back as needed.    Discussed warning signs or symptoms. Please see discharge instructions. Patient expresses understanding.   The above documentation has been reviewed and is accurate and complete Ian Powell, M.D.

## 2020-04-19 ENCOUNTER — Ambulatory Visit: Payer: Commercial Managed Care - PPO | Admitting: Family Medicine

## 2020-04-19 ENCOUNTER — Other Ambulatory Visit: Payer: Self-pay

## 2020-04-19 VITALS — BP 130/86 | HR 64 | Ht 70.0 in | Wt 196.6 lb

## 2020-04-19 DIAGNOSIS — M84451D Pathological fracture, right femur, subsequent encounter for fracture with routine healing: Secondary | ICD-10-CM

## 2020-04-19 DIAGNOSIS — M1711 Unilateral primary osteoarthritis, right knee: Secondary | ICD-10-CM

## 2020-04-19 NOTE — Patient Instructions (Addendum)
Thank you for coming in today.  Please follow up as needed.   We can do the gel shots if you want.  Let me know.   Please use voltaren gel up to 4x daily for pain as needed.    Recheck with me as needed.

## 2021-07-02 ENCOUNTER — Ambulatory Visit (INDEPENDENT_AMBULATORY_CARE_PROVIDER_SITE_OTHER): Payer: Medicare Other

## 2021-07-02 ENCOUNTER — Ambulatory Visit (INDEPENDENT_AMBULATORY_CARE_PROVIDER_SITE_OTHER): Payer: Medicare Other | Admitting: Orthopaedic Surgery

## 2021-07-02 DIAGNOSIS — S83241A Other tear of medial meniscus, current injury, right knee, initial encounter: Secondary | ICD-10-CM

## 2021-07-02 DIAGNOSIS — M25561 Pain in right knee: Secondary | ICD-10-CM

## 2021-07-02 DIAGNOSIS — G8929 Other chronic pain: Secondary | ICD-10-CM

## 2021-07-02 NOTE — Progress Notes (Signed)
Chief Complaint: Right knee pain     History of Present Illness:    Ian Powell is a 71 y.o. male presents with ongoing right knee pain since 2021.  He states that he previously was an avid runner able to run approximately 30 miles a week.  He was going downhill trying to break a personal best record mile and felt a sharp pain in his knee.  At that time he initially was found to have a stress fracture involving the medial aspect of the knee.  He was seen by Dr. Denyse Amass and placed on a period of limited weightbearing for which this pain resolved.  Since that time he has not been able to get back to running.  He does walk approximately up to 5 miles at his best.  He states that he experiences sharp medial based knee pain with any type of running.  He did previously have 1 injection in 2021 which gave him only transient relief.  He has not done formal physical therapy although Dr. Denyse Amass has provided him with a series of knee strengthening exercises which do help somewhat.  He feels like the knee buckles and gives out.  He is still very active does his own yard work with a Firef[employer].  He would like to attempt to get back to running if possible.    Surgical History:   None  PMH/PSH/Family History/Social History/Meds/Allergies:    Past Medical History:  Diagnosis Date   Decreased bone density 12/15/2019   T score -1.6 DEXA scan November 2021   Hypertension    No past surgical history on file. Social History   Socioeconomic History   Marital status: Married    Spouse name: Not on file   Number of children: Not on file   Years of education: Not on file   Highest education level: Not on file  Occupational History   Not on file  Tobacco Use   Smoking status: Former   Smokeless tobacco: Never  Substance and Sexual Activity   Alcohol use: Not on file   Drug use: Never   Sexual activity: Not on file  Other Topics Concern   Not on file  Social History  Narrative   Not on file   Social Determinants of Health   Financial Resource Strain: Not on file  Food Insecurity: Not on file  Transportation Needs: Not on file  Physical Activity: Not on file  Stress: Not on file  Social Connections: Not on file   No family history on file. Allergies  Allergen Reactions   Penicillins Rash   Current Outpatient Medications  Medication Sig Dispense Refill   chlorthalidone (HYGROTON) 25 MG tablet Take 25 mg by mouth daily.  0   Tadalafil 2.5 MG TABS Take 2.5 mg by mouth daily.  5   No current facility-administered medications for this visit.   No results found.  Review of Systems:   A ROS was performed including pertinent positives and negatives as documented in the HPI.  Physical Exam :   Constitutional: NAD and appears stated age Neurological: Alert and oriented Psych: Appropriate affect and cooperative There were no vitals taken for this visit.   Comprehensive Musculoskeletal Exam:      Musculoskeletal Exam  Gait Normal  Alignment Normal   Right Left  Inspection Normal Normal  Palpation    Tenderness None Medial joint line  Crepitus None None  Effusion None None  Range of Motion    Extension -3 -3  Flexion 135 135  Strength    Extension 5/5 5/5  Flexion 5/5 5/5  Ligament Exam     Generalized Laxity No No  Lachman Negative Negative   Pivot Shift Negative Negative  Anterior Drawer Negative Negative  Valgus at 0 Negative Negative  Valgus at 20 Negative Negative  Varus at 0 0 0  Varus at 20   0 0  Posterior Drawer at 90 0 0  Vascular/Lymphatic Exam    Edema None None  Venous Stasis Changes No No  Distal Circulation Normal Normal  Neurologic    Light Touch Sensation Intact Intact  Special Tests: Positive McMurray medially     Imaging:   Xray (4 views right knee): There is mild to moderate joint space narrowing predominantly involving the medial tibiofemoral joint space  MRI (right knee from 2021): There is  evidence of a posterior meniscal root tear with a stress reaction involving the medial femoral condyle medial tibial plateau  I personally reviewed and interpreted the radiographs.   Assessment:   72 y.o. male with a known medial meniscal root injury with extrusion of the meniscus and subsequent stress reaction.  Overall he does sound like his stress issues have improved although he does have persistent medial based knee pain in the setting of a known meniscal root tear.  MRI did show evidence of focal cartilage loss involving the medial joint space.  Overall I have advised that this can be quite a tough issue to treat.  I do believe there is value in obtaining a new MRI of this knee so that we can ultimately evaluate the status of his underlying cartilage along the medial joint space.  He is hoping to get back to running and flat affect I do believe that he may benefit from surgical intervention in the future given the fact that he is failed a strengthening program as well as an injection.  At this time an MRI is required for further assessment and surgical planning to see if he would be a candidate for root repair versus potentially partial knee replacement.  Plan :    -Plan for MRI right knee and follow-up discuss results   I believe that advance imaging in the form of an MRI is indicated for the following reasons: -Xrays images were obtained and not diagnostic -The patient has failed treatment modalities including rest, activity restriction, NSAIDs, strengthening program, steroid injection -The following worrisome symptoms are present on history and exam: Known meniscal root tear that may benefit from surgery, question of progression - MRI is required to assist in specific surgical planning       I personally saw and evaluated the patient, and participated in the management and treatment plan.  Huel Cote, MD Attending Physician, Orthopedic Surgery  This document was dictated  using Dragon voice recognition software. A reasonable attempt at proof reading has been made to minimize errors.

## 2021-07-16 ENCOUNTER — Ambulatory Visit
Admission: RE | Admit: 2021-07-16 | Discharge: 2021-07-16 | Disposition: A | Discharge status: Home / Self Care | Payer: No Typology Code available for payment source | Source: Ambulatory Visit | Attending: Orthopaedic Surgery | Admitting: Orthopaedic Surgery

## 2021-07-16 DIAGNOSIS — S83241A Other tear of medial meniscus, current injury, right knee, initial encounter: Secondary | ICD-10-CM

## 2021-07-18 ENCOUNTER — Ambulatory Visit (INDEPENDENT_AMBULATORY_CARE_PROVIDER_SITE_OTHER): Payer: Medicare Other | Admitting: Orthopaedic Surgery

## 2021-07-18 DIAGNOSIS — S83241A Other tear of medial meniscus, current injury, right knee, initial encounter: Secondary | ICD-10-CM | POA: Diagnosis not present

## 2021-07-18 NOTE — Progress Notes (Signed)
Chief Complaint: Right knee pain     History of Present Illness:   07/18/2021: Ian Powell presents today for his right knee MRI follow-up.  Overall the knee is continuing to remain painful.  He has been limiting his activity  Ian Powell is a 71 y.o. male presents with ongoing right knee pain since 2021.  He states that he previously was an avid runner able to run approximately 30 miles a week.  He was going downhill trying to break a personal best record mile and felt a sharp pain in his knee.  At that time he initially was found to have a stress fracture involving the medial aspect of the knee.  He was seen by Dr. Denyse Amass and placed on a period of limited weightbearing for which this pain resolved.  Since that time he has not been able to get back to running.  He does walk approximately up to 5 miles at his best.  He states that he experiences sharp medial based knee pain with any type of running.  He did previously have 1 injection in 2021 which gave him only transient relief.  He has not done formal physical therapy although Dr. Denyse Amass has provided him with a series of knee strengthening exercises which do help somewhat.  He feels like the knee buckles and gives out.  He is still very active does his own yard work with a [employer].  He would like to attempt to get back to running if possible.    Surgical History:   None  PMH/PSH/Family History/Social History/Meds/Allergies:    Past Medical History:  Diagnosis Date   Decreased bone density 12/15/2019   T score -1.6 DEXA scan November 2021   Hypertension    No past surgical history on file. Social History   Socioeconomic History   Marital status: Married    Spouse name: Not on file   Number of children: Not on file   Years of education: Not on file   Highest education level: Not on file  Occupational History   Not on file  Tobacco Use   Smoking status: Former   Smokeless tobacco: Never  Substance and  Sexual Activity   Alcohol use: Not on file   Drug use: Never   Sexual activity: Not on file  Other Topics Concern   Not on file  Social History Narrative   Not on file   Social Determinants of Health   Financial Resource Strain: Not on file  Food Insecurity: Not on file  Transportation Needs: Not on file  Physical Activity: Not on file  Stress: Not on file  Social Connections: Not on file   No family history on file. Allergies  Allergen Reactions   Penicillins Rash   Current Outpatient Medications  Medication Sig Dispense Refill   chlorthalidone (HYGROTON) 25 MG tablet Take 25 mg by mouth daily.  0   Tadalafil 2.5 MG TABS Take 2.5 mg by mouth daily.  5   No current facility-administered medications for this visit.   MR Knee Right Wo Contrast  Result Date: 07/16/2021 CLINICAL DATA:  Meniscal tear EXAM: MRI OF THE RIGHT KNEE WITHOUT CONTRAST TECHNIQUE: Multiplanar, multisequence MR imaging of the knee was performed. No intravenous contrast was administered. COMPARISON:  11/19/2019 FINDINGS: MENISCI Medial meniscus: Large radial tear of the  posterior horn medial meniscus near the meniscal root, with extensive degenerative tearing in the remainder of the posterior horn with some horizontal extension into the midbody. The degree of degenerative tearing is increased. Lateral meniscus:  Grade 1 signal without surface extension. LIGAMENTS Cruciates:  Unremarkable Collaterals: Proximal popliteus tendinopathy. Otherwise unremarkable. CARTILAGE Patellofemoral: Mild to moderate degenerative chondral thinning with mild marginal spurring. Small non-fragmented osteochondral lesion of the lateral patellar facet on image 5 of series 2 overlying more prominent degree of focal chondral thinning. Medial: Severe degenerative chondral thinning with small foci of subcortical marrow edema along the articular surfaces. Mild marginal spurring. Lateral: Mild chondral thinning with some chondral heterogeneity and  irregularity especially posteriorly along the lateral femoral condyle. Marginal spurring. Joint:  Small knee effusion.  Borderline thickened medial plica. Popliteal Fossa:  Moderate Baker's cyst with some septation. Extensor Mechanism: Edema signal is present both superficial and deep to the iliotibial band without overt ITB thickening. Correlate clinically in assessing for iliotibial band syndrome. Bones: No significant extra-articular osseous abnormalities identified. Other: No supplemental non-categorized findings. IMPRESSION: 1. Large radial tear of the posterior horn medial meniscus with worsening extensive degenerative tearing in the remainder of the posterior horn and some horizontal extension into the midbody. 2. 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 3. Proximal popliteus tendinopathy. 4. Small knee effusion with moderate size Baker's cyst. 5. Borderline thickened medial plica. 6. Cannot exclude mild iliotibial band syndrome given the surrounding edema, correlate with physical exam findings. Electronically Signed   By: Gaylyn Rong M.D.   On: 07/16/2021 12:49    Review of Systems:   A ROS was performed including pertinent positives and negatives as documented in the HPI.  Physical Exam :   Constitutional: NAD and appears stated age Neurological: Alert and oriented Psych: Appropriate affect and cooperative There were no vitals taken for this visit.   Comprehensive Musculoskeletal Exam:      Musculoskeletal Exam  Gait Normal  Alignment Normal   Right Left  Inspection Normal Normal  Palpation    Tenderness Medial joint line None  Crepitus None None  Effusion None None  Range of Motion    Extension -3 -3  Flexion 135 135  Strength    Extension 5/5 5/5  Flexion 5/5 5/5  Ligament Exam     Generalized Laxity No No  Lachman Negative Negative   Pivot Shift Negative Negative  Anterior Drawer Negative Negative  Valgus at 0 Negative Negative  Valgus at 20  Negative Negative  Varus at 0 0 0  Varus at 20   0 0  Posterior Drawer at 90 0 0  Vascular/Lymphatic Exam    Edema None None  Venous Stasis Changes No No  Distal Circulation Normal Normal  Neurologic    Light Touch Sensation Intact Intact  Special Tests: Positive McMurray medially     Imaging:   Xray (4 views right knee): There is mild to moderate joint space narrowing predominantly involving the medial tibiofemoral joint space  MRI (right knee from 2021): There is evidence of a posterior meniscal root tear with a stress reaction involving the medial femoral condyle medial tibial plateau  MRI right knee: There is a meniscal root tear which has been previously noted about with progression of his medial compartment arthritis.  The remainder of his lateral patellofemoral joint space is overall with well-preserved  I personally reviewed and interpreted the radiographs.   Assessment:   71 y.o. male with a known medial meniscal root injury with  extrusion of the meniscus and subsequent stress reaction.  Unfortunately on his MRI today there has been progression of his medial compartment osteoarthritis.  Today I discussed additional treatment options.  I do believe that a osteoarthritis type brace could help provide him additional stability while he is more active.  That being said he is limited in ways that he is hoping to avoid and he would like to be active and be able to walk and run and even play golf.  Overall given the fact that he does have minimal involvement of the remaining lateral patellofemoral joint space with regard to his osteoarthritis I do believe that he would be a candidate possibly for partial knee replacement.  To this effect I like to plan to refer him to Dr. Clayborne Dana for further assessment of this and further discussion of this  Plan :    -Plan for referral to Dr. Clayborne Dana to discuss right knee partial knee replacement      I personally saw and evaluated the  patient, and participated in the management and treatment plan.  Vanetta Mulders, MD Attending Physician, Orthopedic Surgery  This document was dictated using Dragon voice recognition software. A reasonable attempt at proof reading has been made to minimize errors.

## 2021-10-08 ENCOUNTER — Ambulatory Visit (INDEPENDENT_AMBULATORY_CARE_PROVIDER_SITE_OTHER): Payer: Medicare Other | Admitting: Orthopaedic Surgery

## 2021-10-08 ENCOUNTER — Ambulatory Visit (INDEPENDENT_AMBULATORY_CARE_PROVIDER_SITE_OTHER): Payer: Medicare Other

## 2021-10-08 ENCOUNTER — Ambulatory Visit (HOSPITAL_BASED_OUTPATIENT_CLINIC_OR_DEPARTMENT_OTHER): Payer: Self-pay | Admitting: Orthopaedic Surgery

## 2021-10-08 DIAGNOSIS — M25562 Pain in left knee: Secondary | ICD-10-CM

## 2021-10-08 DIAGNOSIS — S838X2A Sprain of other specified parts of left knee, initial encounter: Secondary | ICD-10-CM | POA: Diagnosis not present

## 2021-10-08 NOTE — Progress Notes (Signed)
Chief Complaint: Left knee pain     History of Present Illness:    Ian Powell is a 71 y.o. male presents today with left knee pain after he landed on the knee while doing plyometrics the previous week.  Since that time he feels like the knee is giving out buckling.  He is experiencing pain solely along the medial joint line.  This has become quite significant and bothering him and most activities.  The knee is swollen.  Of note he does have a history of a right knee medial meniscal root tear which has subsequently progressed osteoarthritis.  There is concern for this on the side as well.    Surgical History:   None  PMH/PSH/Family History/Social History/Meds/Allergies:    Past Medical History:  Diagnosis Date   Decreased bone density 12/15/2019   T score -1.6 DEXA scan November 2021   Hypertension    No past surgical history on file. Social History   Socioeconomic History   Marital status: Married    Spouse name: Not on file   Number of children: Not on file   Years of education: Not on file   Highest education level: Not on file  Occupational History   Not on file  Tobacco Use   Smoking status: Former   Smokeless tobacco: Never  Substance and Sexual Activity   Alcohol use: Not on file   Drug use: Never   Sexual activity: Not on file  Other Topics Concern   Not on file  Social History Narrative   Not on file   Social Determinants of Health   Financial Resource Strain: Not on file  Food Insecurity: Not on file  Transportation Needs: Not on file  Physical Activity: Not on file  Stress: Not on file  Social Connections: Not on file   No family history on file. Allergies  Allergen Reactions   Penicillins Rash   Current Outpatient Medications  Medication Sig Dispense Refill   chlorthalidone (HYGROTON) 25 MG tablet Take 25 mg by mouth daily.  0   Tadalafil 2.5 MG TABS Take 2.5 mg by mouth daily.  5   No current  facility-administered medications for this visit.   No results found.  Review of Systems:   A ROS was performed including pertinent positives and negatives as documented in the HPI.  Physical Exam :   Constitutional: NAD and appears stated age Neurological: Alert and oriented Psych: Appropriate affect and cooperative There were no vitals taken for this visit.   Comprehensive Musculoskeletal Exam:      Musculoskeletal Exam  Gait Normal  Alignment Normal   Right Left  Inspection Normal Normal  Palpation    Tenderness Medial Medial  Crepitus None None  Effusion None Positive  Range of Motion    Extension 0 0  Flexion 135 135  Strength    Extension 5/5 5/5  Flexion 5/5 5/5  Ligament Exam     Generalized Laxity No No  Lachman Negative Negative   Pivot Shift Negative Negative  Anterior Drawer Negative Negative  Valgus at 0 Negative Negative  Valgus at 20 Negative Negative  Varus at 0 0 0  Varus at 20   0 0  Posterior Drawer at 90 0 0  Vascular/Lymphatic Exam    Edema None None  Venous Stasis  Changes No No  Distal Circulation Normal Normal  Neurologic    Light Touch Sensation Intact Intact  Special Tests: Positive McMurray on the left     Imaging:   Xray (4 view left knee): There is mild medial joint space narrowing but otherwise normal   I personally reviewed and interpreted the radiographs.   Assessment:   71 y.o. male with a concern for a left medial meniscal root tear after traumatic injury where he landed on the knee and felt it give way.  At this time I would like to recommend an MRI so that we can rule out an underlying meniscal root tear particularly as he does have a history of this on the contralateral side.  We will plan to proceed with MRI left knee and follow-up discuss results  Plan :    -Plan for MRI left knee and follow-up to discuss results     I personally saw and evaluated the patient, and participated in the management and treatment  plan.  Vanetta Mulders, MD Attending Physician, Orthopedic Surgery  This document was dictated using Dragon voice recognition software. A reasonable attempt at proof reading has been made to minimize errors.

## 2021-10-18 ENCOUNTER — Ambulatory Visit
Admission: RE | Admit: 2021-10-18 | Discharge: 2021-10-18 | Disposition: A | Discharge status: Home / Self Care | Payer: Medicare Other | Source: Ambulatory Visit | Attending: Orthopaedic Surgery | Admitting: Orthopaedic Surgery

## 2021-10-18 DIAGNOSIS — S838X2A Sprain of other specified parts of left knee, initial encounter: Secondary | ICD-10-CM

## 2021-10-20 ENCOUNTER — Other Ambulatory Visit (HOSPITAL_BASED_OUTPATIENT_CLINIC_OR_DEPARTMENT_OTHER): Payer: Self-pay

## 2021-10-20 ENCOUNTER — Ambulatory Visit (INDEPENDENT_AMBULATORY_CARE_PROVIDER_SITE_OTHER): Payer: Medicare Other | Admitting: Orthopaedic Surgery

## 2021-10-20 DIAGNOSIS — S838X2A Sprain of other specified parts of left knee, initial encounter: Secondary | ICD-10-CM

## 2021-10-20 MED ORDER — MELOXICAM 15 MG PO TABS: 15.0000 mg | ORAL_TABLET | Freq: Every day | ORAL | 0 refills | Status: DC

## 2021-10-20 MED ORDER — MELOXICAM 15 MG PO TABS
15.0000 mg | ORAL_TABLET | Freq: Every day | ORAL | 0 refills | Status: AC
  Filled 2021-10-20: qty 14, 14d supply, fill #0

## 2021-10-20 NOTE — Progress Notes (Signed)
Chief Complaint: Left knee pain     History of Present Illness:   10/20/2021: JT presents today for follow-up of his left knee injury.  He is here today for MRI review.  He states that the knee feels quite similar and is persistently painful medially.  The swelling is improved.  Ian Powell is a 71 y.o. male presents today with left knee pain after he landed on the knee while doing plyometrics the previous week.  Since that time he feels like the knee is giving out buckling.  He is experiencing pain solely along the medial joint line.  This has become quite significant and bothering him and most activities.  The knee is swollen.  Of note he does have a history of a right knee medial meniscal root tear which has subsequently progressed osteoarthritis.  There is concern for this on the side as well.    Surgical History:   None  PMH/PSH/Family History/Social History/Meds/Allergies:    Past Medical History:  Diagnosis Date   Decreased bone density 12/15/2019   T score -1.6 DEXA scan November 2021   Hypertension    No past surgical history on file. Social History   Socioeconomic History   Marital status: Married    Spouse name: Not on file   Number of children: Not on file   Years of education: Not on file   Highest education level: Not on file  Occupational History   Not on file  Tobacco Use   Smoking status: Former   Smokeless tobacco: Never  Substance and Sexual Activity   Alcohol use: Not on file   Drug use: Never   Sexual activity: Not on file  Other Topics Concern   Not on file  Social History Narrative   Not on file   Social Determinants of Health   Financial Resource Strain: Not on file  Food Insecurity: Not on file  Transportation Needs: Not on file  Physical Activity: Not on file  Stress: Not on file  Social Connections: Not on file   No family history on file. Allergies  Allergen Reactions   Penicillins Rash    Current Outpatient Medications  Medication Sig Dispense Refill   chlorthalidone (HYGROTON) 25 MG tablet Take 25 mg by mouth daily.  0   meloxicam (MOBIC) 15 MG tablet Take 1 tablet (15 mg total) by mouth daily for 14 days. 14 tablet 0   Tadalafil 2.5 MG TABS Take 2.5 mg by mouth daily.  5   No current facility-administered medications for this visit.   No results found.  Review of Systems:   A ROS was performed including pertinent positives and negatives as documented in the HPI.  Physical Exam :   Constitutional: NAD and appears stated age Neurological: Alert and oriented Psych: Appropriate affect and cooperative There were no vitals taken for this visit.   Comprehensive Musculoskeletal Exam:      Musculoskeletal Exam  Gait Normal  Alignment Normal   Right Left  Inspection Normal Normal  Palpation    Tenderness Medial Medial  Crepitus None None  Effusion None Positive  Range of Motion    Extension 0 0  Flexion 135 135  Strength    Extension 5/5 5/5  Flexion 5/5 5/5  Ligament Exam     Generalized Laxity No No  Lachman Negative Negative   Pivot Shift Negative Negative  Anterior Drawer Negative Negative  Valgus at 0 Negative Negative  Valgus at 20 Negative Negative  Varus at 0 0 0  Varus at 20   0 0  Posterior Drawer at 90 0 0  Vascular/Lymphatic Exam    Edema None None  Venous Stasis Changes No No  Distal Circulation Normal Normal  Neurologic    Light Touch Sensation Intact Intact  Special Tests: Positive McMurray on the left     Imaging:   Xray (4 view left knee): There is mild medial joint space narrowing but otherwise normal  MRI left knee: Medial femoral condyle bony contusion with a small posterior body meniscal root tear  I personally reviewed and interpreted the radiographs.   Assessment:   71 y.o. male with a left medial femoral condyle bony contusion as well as a posterior meniscal tear without extension into the root.  I did describe  that overall I do believe that this would have a good prognosis with a 2-week course of anti-inflammatories given the fact that bone bruises do tend to resolve on their own.  I will plan to see him back in 3 weeks and at that time I will reassess him.  If he is persistently having pain we could consider arthroscopy for meniscal debridement although I do believe that this will likely improve with conservative management Plan :    -Return to clinic in 3 weeks for reassessment     I personally saw and evaluated the patient, and participated in the management and treatment plan.  Huel Cote, MD Attending Physician, Orthopedic Surgery  This document was dictated using Dragon voice recognition software. A reasonable attempt at proof reading has been made to minimize errors.

## 2021-11-05 ENCOUNTER — Ambulatory Visit (HOSPITAL_BASED_OUTPATIENT_CLINIC_OR_DEPARTMENT_OTHER): Payer: No Typology Code available for payment source | Admitting: Orthopaedic Surgery

## 2021-11-10 ENCOUNTER — Ambulatory Visit (INDEPENDENT_AMBULATORY_CARE_PROVIDER_SITE_OTHER): Payer: Medicare Other | Admitting: Orthopaedic Surgery

## 2021-11-10 DIAGNOSIS — S838X2A Sprain of other specified parts of left knee, initial encounter: Secondary | ICD-10-CM | POA: Diagnosis not present

## 2021-11-10 NOTE — Progress Notes (Signed)
Chief Complaint: Left knee pain     History of Present Illness:   11/10/2021: Ian Powell presents today for follow-up of his left knee bony contusion.  Overall was feeling much better until he began to run a mile and at that time his pain feels like it is gone back to where it was previously.  Ian Powell is a 71 y.o. male presents today with left knee pain after he landed on the knee while doing plyometrics the previous week.  Since that time he feels like the knee is giving out buckling.  He is experiencing pain solely along the medial joint line.  This has become quite significant and bothering him and most activities.  The knee is swollen.  Of note he does have a history of a right knee medial meniscal root tear which has subsequently progressed osteoarthritis.  There is concern for this on the side as well.    Surgical History:   None  PMH/PSH/Family History/Social History/Meds/Allergies:    Past Medical History:  Diagnosis Date   Decreased bone density 12/15/2019   T score -1.6 DEXA scan November 2021   Hypertension    No past surgical history on file. Social History   Socioeconomic History   Marital status: Married    Spouse name: Not on file   Number of children: Not on file   Years of education: Not on file   Highest education level: Not on file  Occupational History   Not on file  Tobacco Use   Smoking status: Former   Smokeless tobacco: Never  Substance and Sexual Activity   Alcohol use: Not on file   Drug use: Never   Sexual activity: Not on file  Other Topics Concern   Not on file  Social History Narrative   Not on file   Social Determinants of Health   Financial Resource Strain: Not on file  Food Insecurity: Not on file  Transportation Needs: Not on file  Physical Activity: Not on file  Stress: Not on file  Social Connections: Not on file   No family history on file. Allergies  Allergen Reactions   Penicillins Rash    Current Outpatient Medications  Medication Sig Dispense Refill   chlorthalidone (HYGROTON) 25 MG tablet Take 25 mg by mouth daily.  0   Tadalafil 2.5 MG TABS Take 2.5 mg by mouth daily.  5   No current facility-administered medications for this visit.   No results found.  Review of Systems:   A ROS was performed including pertinent positives and negatives as documented in the HPI.  Physical Exam :   Constitutional: NAD and appears stated age Neurological: Alert and oriented Psych: Appropriate affect and cooperative There were no vitals taken for this visit.   Comprehensive Musculoskeletal Exam:      Musculoskeletal Exam  Gait Normal  Alignment Normal   Right Left  Inspection Normal Normal  Palpation    Tenderness Medial Medial  Crepitus None None  Effusion None Positive  Range of Motion    Extension 0 0  Flexion 135 135  Strength    Extension 5/5 5/5  Flexion 5/5 5/5  Ligament Exam     Generalized Laxity No No  Lachman Negative Negative   Pivot Shift Negative Negative  Anterior Drawer Negative Negative  Valgus at  0 Negative Negative  Valgus at 20 Negative Negative  Varus at 0 0 0  Varus at 20   0 0  Posterior Drawer at 90 0 0  Vascular/Lymphatic Exam    Edema None None  Venous Stasis Changes No No  Distal Circulation Normal Normal  Neurologic    Light Touch Sensation Intact Intact  Special Tests: Positive McMurray on the left     Imaging:   Xray (4 view left knee): There is mild medial joint space narrowing but otherwise normal  MRI left knee: Medial femoral condyle bony contusion with a small posterior body meniscal root tear  I personally reviewed and interpreted the radiographs.   Assessment:   71 y.o. male with a left medial femoral condyle bony contusion as well as a posterior meniscal tear without extension into the root.  I did describe that overall I do believe that this would have a good prognosis.  At this time I have recommended  ultrasound-guided injection to hopefully get him some pain relief.  He will continue to refrain from running for 2 to 3 weeks and try to ramp up potentially on a track.  I will plan to see him back in 3 to 4 weeks for reassessment Plan :    -Return to clinic in 3 weeks for reassessment -Left knee ultrasound-guided injection performed after verbal consent obtained    I personally saw and evaluated the patient, and participated in the management and treatment plan.  Vanetta Mulders, MD Attending Physician, Orthopedic Surgery  This document was dictated using Dragon voice recognition software. A reasonable attempt at proof reading has been made to minimize errors.

## 2021-12-08 ENCOUNTER — Ambulatory Visit (INDEPENDENT_AMBULATORY_CARE_PROVIDER_SITE_OTHER): Payer: Medicare Other | Admitting: Orthopaedic Surgery

## 2021-12-08 DIAGNOSIS — S83241A Other tear of medial meniscus, current injury, right knee, initial encounter: Secondary | ICD-10-CM | POA: Diagnosis not present

## 2021-12-08 NOTE — Progress Notes (Signed)
Chief Complaint: Left knee pain     History of Present Illness:   12/08/2021: Ian Powell presents today for follow-up of his left knee bony contusion.  Overall his left knee does feel much better at this time.  He states that he is still having some tenderness posterior medially.  Unfortunately his right knee is what is keeping him from being active at this time.  He is still hoping to avoid any type of arthroplasty.  Pain is predominantly medial with regard to the right knee.  Ian Powell is a 71 y.o. male presents today with left knee pain after he landed on the knee while doing plyometrics the previous week.  Since that time he feels like the knee is giving out buckling.  He is experiencing pain solely along the medial joint line.  This has become quite significant and bothering him and most activities.  The knee is swollen.  Of note he does have a history of a right knee medial meniscal root tear which has subsequently progressed osteoarthritis.  There is concern for this on the side as well.    Surgical History:   None  PMH/PSH/Family History/Social History/Meds/Allergies:    Past Medical History:  Diagnosis Date   Decreased bone density 12/15/2019   T score -1.6 DEXA scan November 2021   Hypertension    No past surgical history on file. Social History   Socioeconomic History   Marital status: Married    Spouse name: Not on file   Number of children: Not on file   Years of education: Not on file   Highest education level: Not on file  Occupational History   Not on file  Tobacco Use   Smoking status: Former   Smokeless tobacco: Never  Substance and Sexual Activity   Alcohol use: Not on file   Drug use: Never   Sexual activity: Not on file  Other Topics Concern   Not on file  Social History Narrative   Not on file   Social Determinants of Health   Financial Resource Strain: Not on file  Food Insecurity: Not on file  Transportation  Needs: Not on file  Physical Activity: Not on file  Stress: Not on file  Social Connections: Not on file   No family history on file. Allergies  Allergen Reactions   Penicillins Rash   Current Outpatient Medications  Medication Sig Dispense Refill   chlorthalidone (HYGROTON) 25 MG tablet Take 25 mg by mouth daily.  0   Tadalafil 2.5 MG TABS Take 2.5 mg by mouth daily.  5   No current facility-administered medications for this visit.   No results found.  Review of Systems:   A ROS was performed including pertinent positives and negatives as documented in the HPI.  Physical Exam :   Constitutional: NAD and appears stated age Neurological: Alert and oriented Psych: Appropriate affect and cooperative There were no vitals taken for this visit.   Comprehensive Musculoskeletal Exam:      Musculoskeletal Exam  Gait Normal  Alignment Normal   Right Left  Inspection Normal Normal  Palpation    Tenderness Medial Medial  Crepitus None None  Effusion None Positive  Range of Motion    Extension 0 0  Flexion 135 135  Strength    Extension 5/5 5/5  Flexion 5/5 5/5  Ligament Exam     Generalized Laxity No No  Lachman Negative Negative   Pivot Shift Negative Negative  Anterior Drawer Negative Negative  Valgus at 0 Negative Negative  Valgus at 20 Negative Negative  Varus at 0 0 0  Varus at 20   0 0  Posterior Drawer at 90 0 0  Vascular/Lymphatic Exam    Edema None None  Venous Stasis Changes No No  Distal Circulation Normal Normal  Neurologic    Light Touch Sensation Intact Intact  Special Tests: Positive McMurray on the left     Imaging:   Xray (4 view left knee): There is mild medial joint space narrowing but otherwise normal  MRI left knee: Medial femoral condyle bony contusion with a small posterior body meniscal root tear  I personally reviewed and interpreted the radiographs.   Assessment:   71 y.o. male with a left medial femoral condyle bony contusion  as well as a posterior meniscal tear without extension into the root.  I did discuss treatment options for both the left and the right knee.  Overall I did discuss that should his left knee remains symptomatic we may need to address this with meniscal debridement.  That being said his right knee is more symptomatic for him.  We again discussed treatment options.  He has seen Dr. Garner Gavel for discussion of left partial knee replacement.  I had a discussion that there would be a component of possible intraoperative need for total knee arthroplasty to that effect he is hoping to avoid this.  Overall I do believe that he may be a candidate for PRP for the right knee to hopefully get him some pain relief as he is hoping to avoid arthroplasty and he has already trialed steroid options on the right knee.  At this time he would like referral for PRP for the right knee Plan :    -Plan for referral to Dr. Shon Baton for right knee PRP     I personally saw and evaluated the patient, and participated in the management and treatment plan.  Huel Cote, MD Attending Physician, Orthopedic Surgery  This document was dictated using Dragon voice recognition software. A reasonable attempt at proof reading has been made to minimize errors.

## 2021-12-22 ENCOUNTER — Encounter: Payer: Self-pay | Admitting: Sports Medicine

## 2021-12-22 ENCOUNTER — Ambulatory Visit (INDEPENDENT_AMBULATORY_CARE_PROVIDER_SITE_OTHER): Payer: Medicare Other | Admitting: Sports Medicine

## 2021-12-22 DIAGNOSIS — Q6672 Congenital pes cavus, left foot: Secondary | ICD-10-CM

## 2021-12-22 DIAGNOSIS — M25561 Pain in right knee: Secondary | ICD-10-CM

## 2021-12-22 DIAGNOSIS — S83241A Other tear of medial meniscus, current injury, right knee, initial encounter: Secondary | ICD-10-CM

## 2021-12-22 DIAGNOSIS — Q6671 Congenital pes cavus, right foot: Secondary | ICD-10-CM

## 2021-12-22 DIAGNOSIS — M170 Bilateral primary osteoarthritis of knee: Secondary | ICD-10-CM | POA: Diagnosis not present

## 2021-12-22 DIAGNOSIS — G8929 Other chronic pain: Secondary | ICD-10-CM

## 2021-12-22 NOTE — Progress Notes (Signed)
Ian Powell - 71 y.o. male MRN [id number]  Date of birth: 1950-02-15  Office Visit Note: Visit Date: 12/22/2021 PCP: Ralene Ok, MD Referred by: Huel Cote, MD  Subjective: Chief Complaint  Patient presents with   Right Knee - Pain   HPI: Ian Powell is a pleasant 71 y.o. male who presents today for acute on chronic right knee pain.  Patient has history of arthritis of both knees and has had intermittent pain.  He has been seen by my partner, Dr. Steward Drone.  He did discuss with him possible left partial knee replacement with  Dr. Garner Gavel.  In terms of his right knee, this is a chronic issue but has been exacerbated and is most bothersome preventing him from walking and daily activity.  He has had a discussion with Dr. Steward Drone about possible PRP.  He would like to discuss this with me today.  Pain is over the medial joint line.  Worse after activities such as running and walking.  He gets intermittent swelling.  He did have a corticosteroid injection which did provide him good pain relief for only about 4 to 6 weeks.  Has never had orthotics before.  Does note that he has wear on the lateral aspect of his shoes.  Pertinent ROS were reviewed with the patient and found to be negative unless otherwise specified above in HPI.   Chart review - I reviewed the notes associated with this visit in which this consultation/referral was placed.  Reviewed prior imaging and notes from Dr. Steward Drone, last on 12/08/21.  Assessment & Plan: Visit Diagnoses:  1. Chronic pain of right knee   2. Acute medial meniscus tear of right knee, initial encounter   3. Pes cavus of both feet    Plan: Discussed with "JT" the origin of his knee pain.  He is having an acute flare of his underlying medial joint osteoarthritis.  His pain is also likely exacerbated by a prior posterior medial meniscus tear.  Has discussed surgical options in the past.  Did get about 4-6 weeks of relief from a corticosteroid  injection.  At this point, we discussed through shared decision making to proceed with scheduling a PRP injection for the knee.  We discussed both preinjection and postinjection protocol today.  I did discuss with him he needs to continue discontinuing any NSAIDs or anti-inflammatory medications for 10 days prior to his injection.  In terms of his pes cavus, I do think this is contributing to some of his knee pain bilaterally, I did fit him for orthotic insoles today which she found to be comfortable.   Follow-up: Return for Schedule for PRP injection when works for patient.   Meds & Orders: No orders of the defined types were placed in this encounter.  No orders of the defined types were placed in this encounter.    Procedures: No procedures performed      Clinical History: No specialty comments available.  He reports that he has quit smoking. He has never used smokeless tobacco. No results for input(s): "HGBA1C", "LABURIC" in the last 8760 hours.  Objective:    Physical Exam  Gen: Well-appearing, in no acute distress; non-toxic CV: Regular Rate. Well-perfused. Warm.  Resp: Breathing unlabored on room air; no wheezing. Psych: Fluid speech in conversation; appropriate affect; normal thought process Neuro: Sensation intact throughout. No gross coordination deficits.   Ortho Exam - Right knee: There is some mild TTP over the medial joint line.  Small joint effusion  noted without erythema or significant warmth.  Range of motion from 0-115 degrees.  No varus or valgus instability.  Strength 5/5 in all directions.  There is patellar crepitus with knee flexion and extension.  Mild valgus stance.  - Feet: There is pes cavus bilaterally with the knees going into a slightly valgus fashion with supination upon walking.  Imaging:  *I independently reviewed the complete 4 view right knee x-rays from 07/02/2021 as well as the right knee MRI on 07/16/2021.  Both the x-ray and the MRI demonstrate  tricompartmental arthritic change, with near bone-on-bone change at the medial joint space.  There is evidence of a radial tear of the posterior horn of the medial meniscus and a small joint effusion.  CLINICAL DATA:  Meniscal tear   EXAM: MRI OF THE RIGHT KNEE WITHOUT CONTRAST   TECHNIQUE: Multiplanar, multisequence MR imaging of the knee was performed. No intravenous contrast was administered.   COMPARISON:  11/19/2019   FINDINGS: MENISCI   Medial meniscus: Large radial tear of the posterior horn medial meniscus near the meniscal root, with extensive degenerative tearing in the remainder of the posterior horn with some horizontal extension into the midbody. The degree of degenerative tearing is increased.   Lateral meniscus:  Grade 1 signal without surface extension.   LIGAMENTS   Cruciates:  Unremarkable   Collaterals: Proximal popliteus tendinopathy. Otherwise unremarkable.   CARTILAGE   Patellofemoral: Mild to moderate degenerative chondral thinning with mild marginal spurring. Small non-fragmented osteochondral lesion of the lateral patellar facet on image 5 of series 2 overlying more prominent degree of focal chondral thinning.   Medial: Severe degenerative chondral thinning with small foci of subcortical marrow edema along the articular surfaces. Mild marginal spurring.   Lateral: Mild chondral thinning with some chondral heterogeneity and irregularity especially posteriorly along the lateral femoral condyle. Marginal spurring.   Joint:  Small knee effusion.  Borderline thickened medial plica.   Popliteal Fossa:  Moderate Baker's cyst with some septation.   Extensor Mechanism: Edema signal is present both superficial and deep to the iliotibial band without overt ITB thickening. Correlate clinically in assessing for iliotibial band syndrome.   Bones: No significant extra-articular osseous abnormalities identified.   Other: No supplemental  non-categorized findings.   IMPRESSION: 1. Large radial tear of the posterior horn medial meniscus with worsening extensive degenerative tearing in the remainder of the posterior horn and some horizontal extension into the midbody. 2. 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 3. Proximal popliteus tendinopathy. 4. Small knee effusion with moderate size Baker's cyst. 5. Borderline thickened medial plica. 6. Cannot exclude mild iliotibial band syndrome given the surrounding edema, correlate with physical exam findings.  Past Medical/Family/Surgical/Social History: Medications & Allergies reviewed per EMR, new medications updated. Patient Active Problem List   Diagnosis Date Noted   HTN (hypertension) 01/19/2020   Decreased bone density 12/15/2019   Insufficiency fracture of femur (HCC) 12/15/2019   Past Medical History:  Diagnosis Date   Decreased bone density 12/15/2019   T score -1.6 DEXA scan November 2021   Hypertension    History reviewed. No pertinent family history. History reviewed. No pertinent surgical history. Social History   Occupational History   Not on file  Tobacco Use   Smoking status: Former   Smokeless tobacco: Never  Substance and Sexual Activity   Alcohol use: Not on file   Drug use: Never   Sexual activity: Not on file

## 2021-12-22 NOTE — Progress Notes (Signed)
Right knee pain; here to discuss PRP in the right knee. Has had cortisone injections in the past with minimal relief

## 2021-12-22 NOTE — Patient Instructions (Addendum)
Dr. Shon Baton' Instructions and What to Expect for PRP Injections:  Platelet-rich plasma is used in musculoskeletal medicine to focus your own body's ability to heal. It has several well-done published randomized control trials (RCT) which demonstrate both its effectiveness and safety in many musculoskeletal conditions, including osteoarthritis, tendinopathies, and damaged vertebral discs. PRP has been in clinical use since the 1990's. Many people know that platelets form a clot if there is a cut in the skin. It turns out that platelets do not only form a clot, they also start the body's own repair process. When platelets activate to form a clot, they also release alpha granules which have hundreds of chemical messengers in them that initiate and organize repair to the damaged tissue. Precisely placing PRP into the site of injury will initiate the healing process by activating on the damaged cartilage or tendon. This is an inflammatory process, and inflammation is the vital first phase of Healing.  What to expect and how to prepare for PRP   2 weeks prior to the procedure: depending on the procedure, you may need to arrange for a driver to bring you home. IF you are having a lower extremity procedure, we can provide crutches as needed.   10 days prior to the procedure: Stop taking anti-inflammatory drugs like Ibuprofen/Motrin, Advil, Naprosyn, Celebrex, or Meloxicam. Even aspirin should be stopped (but need to discuss this with Dr. Shon Baton and your cardiologist beforehand if this pertains to you). Let Dr. Shon Baton know if you have been taking prednisone or other corticosteroids in the last month.   The day before the procedure: thoroughly shower and clean your skin.    The day of the procedure: Wear loose-fitting clothing like sweatpants or shorts. If you are having an upper body procedure wear a top that can button or zip up.  PRP will initiate healing and a productive inflammation, and PRP  therapy will make the body part treated sore for 4 days to two weeks. Anti-inflammatory drugs (i.e. ibuprofen, Naprosyn, Celebrex) and corticosteroids such as prednisone can blunt or stop this process, so it is important to not take any anti-inflammatory drugs for 7 days before getting PRP therapy, or for at least three weeks after PRP therapy. Corticosteroid injections can blunt inflammation for 30 days, so let us know if you have had one recently. Depending on the body part injected, you may be in a sling or on crutches for several days. Just like wringing out a wet dishcloth, if you load or tense a tendon or ligament that has just been injected with PRP, some of the PRP injected will squish out. By keeping the body part treated relaxed by using a sling (for the shoulder or arm) or crutches (for hips and legs) for a few days, the PRP can bind in place and do its job.   You may need a driver to bring you home.  Tobacco/nicotine is a potent toxin and its use constricts small blood vessels which are needed for tissue repair.  Tobacco/nicotine use will limit the effectiveness of any treatment and stopping tobacco use is one of the single  greatest actions you can take to improve your health. Avoid toxins like alcohol, which inhibits and depresses the cells needed for tissue repair.  What happens during the PRP procedure?  Platelet rich plasma is made by taking some of your blood and performing a two-stage centrifuge process on it to concentrate the PRP. First, your blood is drawn into a syringe with a small amount  of anti-coagulant in it (this is to keep the blood from clotting during this process). The amount of blood drawn is usually about 10-30 milliliters, depending on how much PRP is needed for the treatment.  (There are 355 milliliters in a 12-ounce soda can for comparison).  Then the blood is transferred in a sterile fashion into a centrifuge tube. It is then centrifuged for the first  cycle where the red blood cells are isolated and discarded. In the second centrifuge cycle, the platelet-rich fraction of the remaining plasma is concentrated and placed in a syringe. The skin at the injection site is numbed with a small amount of topical cooling spray. Dr Benjamin Stain will then precisely inject the PRP into the injury site using ultrasound guidance.  What to do after your procedure  I will give you specific medicine to control any discomfort you may have after the procedure. Avoid NSAIDs like ibuprofen, aleve, or motrin Acetaminophen can be used for mild pain.  Depending on the part of the body treated, usually you will be placed in a sling or on crutches for 1 to 3 days. Do your best not to tense or load the treated area during this time. After 3 days, unless otherwise instructed, the treated body part should be used and slowly moved through its full range of motion. It will be sore, but you will not be doing damage by moving it, in f  act it needs to move to heal. If you were on crutches for a period of time, walking is ok once you are off the crutches. For now, avoid activities that specifically hurt you before being treated. Exercise is vital to good health and finding a way to cross train around your injury is important not only for your physical health, but for your mental health as well. Ask me about cross training options for your injury. Some brief (10 minutes or less) period of heat or ice therapy will not hurt the therapy, but it is not required. Usually, depending on the initial injury, physical therapy is started from two weeks to four weeks after injection. Improvements in pain and function should be expected from 8 weeks to 12 weeks after injection and some injuries may require more than one treatment.

## 2022-01-02 ENCOUNTER — Ambulatory Visit: Payer: No Typology Code available for payment source | Admitting: Sports Medicine

## 2022-01-16 ENCOUNTER — Ambulatory Visit: Payer: Self-pay

## 2022-01-16 ENCOUNTER — Encounter: Payer: Self-pay | Admitting: Sports Medicine

## 2022-01-16 ENCOUNTER — Ambulatory Visit: Payer: Self-pay | Admitting: Sports Medicine

## 2022-01-16 DIAGNOSIS — M84451S Pathological fracture, right femur, sequela: Secondary | ICD-10-CM

## 2022-01-16 DIAGNOSIS — M25561 Pain in right knee: Secondary | ICD-10-CM

## 2022-01-16 DIAGNOSIS — G8929 Other chronic pain: Secondary | ICD-10-CM

## 2022-01-16 NOTE — Patient Instructions (Signed)
*  Post-PRP Injection Guidelines: No anti-inflammatories (ibuprofen/motrin, aleve, meloxicam, etc.) for 2 weeks.  No ice for 2 weeks.  Short prescription of tramadol may be written if pain is severe, call if needed. We discussed appropriate rest / bracing / and timeframe for beginning therapy - if you have any questions, do not hesitate to call or message. Most people do not need crutches, but if you do we can provide these at clinic.   *First week = no activity. Only walking around the house, daily activities. *Week 2 = may do leisurely walking, gentle cycling/biking, or swimming but only light activity  *After 2 weeks, may get back into full activity.

## 2022-01-16 NOTE — Progress Notes (Signed)
Right knee PRP

## 2022-01-16 NOTE — Progress Notes (Signed)
   Procedure Note  Patient: Ian Powell             Date of Birth: 02-26-1950           MRN: [id number]             Visit Date: 01/16/2022  Procedures: Visit Diagnoses:  1. Chronic pain of right knee   2. Subchondral insufficiency fracture of condyle of right femur, sequela    Ultrasound-guided PRP Knee Injection, Right: After discussion on risks/benefits/indications, informed verbal consent was obtained and a timeout was performed. The patient was lying supine on exam table with knee bolster underneath affected knee for comfort. The patient's right knee was prepped with Chloraprep and alcohol swabs. Utilizing ultrasound-guidance with the probe in a transverse position, the patient's suprapatellar bursa was identified and the surrounding soft tissue area was anesthesized first with a 25G, 1.5" needle with approximately 3 cc of lidocaine 1% using sterile technique, but none was delivered into the knee joint or bursa itself. Following this, using ultrasound guidance via an in-plane approach, a 22G, 1.5" needle was directed into the suprapatellar pouch of the knee joint and subsequently injected intraarticularly with 5 cc of platelet-rich-plasma (leukocyte-poor). Visualization of injectate spread within the knee joint was visualized dynamically under ultrasound guidance. Patient tolerated the procedure well without immediate complications.   Kit: RegenLab: RegenPlasma; BCT-3 kit   *Post-PRP Injection Guidelines: No anti-inflammatories (ibuprofen/motrin, aleve, meloxicam, etc.) for 2 weeks.  No ice for 2 weeks.  Short prescription of tramadol may be written if pain is severe, call if needed. Appropriate rest / bracing / and timeframe for beginning therapy discussed and patient endorsed understanding. Patient has crutches at home to use as needed.  Elba Barman, DO Primary Care Sports Medicine Physician  [place]  This note was dictated using Dragon naturally speaking  software and may contain errors in syntax, spelling, or content which have not been identified prior to signing this note.

## 2022-02-09 ENCOUNTER — Ambulatory Visit (HOSPITAL_BASED_OUTPATIENT_CLINIC_OR_DEPARTMENT_OTHER): Payer: No Typology Code available for payment source | Admitting: Orthopaedic Surgery

## 2022-02-11 ENCOUNTER — Ambulatory Visit (INDEPENDENT_AMBULATORY_CARE_PROVIDER_SITE_OTHER): Payer: Medicare Other | Admitting: Orthopaedic Surgery

## 2022-02-11 DIAGNOSIS — M1711 Unilateral primary osteoarthritis, right knee: Secondary | ICD-10-CM | POA: Diagnosis not present

## 2022-02-11 NOTE — Progress Notes (Signed)
Chief Complaint: Right knee pain     History of Present Illness:   02/11/2022: Presents today for follow-up of his right knee.  Overall he is still having persistent pain despite PRP in the right knee.  He is working on rehabbing the right knee.  At this point he still remains unable to jog for more than a mile.  This is quite disconcerting to him as he hopes to maintain an active lifestyle.  He did get very good initial relief from the PRP although this is unfortunately worn off.  Ian Powell is a 72 y.o. male presents today with left knee pain after he landed on the knee while doing plyometrics the previous week.  Since that time he feels like the knee is giving out buckling.  He is experiencing pain solely along the medial joint line.  This has become quite significant and bothering him and most activities.  The knee is swollen.  Of note he does have a history of a right knee medial meniscal root tear which has subsequently progressed osteoarthritis.  There is concern for this on the side as well.    Surgical History:   None  PMH/PSH/Family History/Social History/Meds/Allergies:    Past Medical History:  Diagnosis Date   Decreased bone density 12/15/2019   T score -1.6 DEXA scan November 2021   Hypertension    No past surgical history on file. Social History   Socioeconomic History   Marital status: Married    Spouse name: Not on file   Number of children: Not on file   Years of education: Not on file   Highest education level: Not on file  Occupational History   Not on file  Tobacco Use   Smoking status: Former   Smokeless tobacco: Never  Substance and Sexual Activity   Alcohol use: Not on file   Drug use: Never   Sexual activity: Not on file  Other Topics Concern   Not on file  Social History Narrative   Not on file   Social Determinants of Health   Financial Resource Strain: Not on file  Food Insecurity: Not on file   Transportation Needs: Not on file  Physical Activity: Not on file  Stress: Not on file  Social Connections: Not on file   No family history on file. Allergies  Allergen Reactions   Penicillins Rash   Current Outpatient Medications  Medication Sig Dispense Refill   chlorthalidone (HYGROTON) 25 MG tablet Take 25 mg by mouth daily.  0   Tadalafil 2.5 MG TABS Take 2.5 mg by mouth daily.  5   No current facility-administered medications for this visit.   No results found.  Review of Systems:   A ROS was performed including pertinent positives and negatives as documented in the HPI.  Physical Exam :   Constitutional: NAD and appears stated age Neurological: Alert and oriented Psych: Appropriate affect and cooperative There were no vitals taken for this visit.   Comprehensive Musculoskeletal Exam:      Musculoskeletal Exam  Gait Normal  Alignment Normal   Right Left  Inspection Normal Normal  Palpation    Tenderness Medial Medial  Crepitus None None  Effusion None Positive  Range of Motion    Extension 0 0  Flexion 135 135  Strength  Extension 5/5 5/5  Flexion 5/5 5/5  Ligament Exam     Generalized Laxity No No  Lachman Negative Negative   Pivot Shift Negative Negative  Anterior Drawer Negative Negative  Valgus at 0 Negative Negative  Valgus at 20 Negative Negative  Varus at 0 0 0  Varus at 20   0 0  Posterior Drawer at 90 0 0  Vascular/Lymphatic Exam    Edema None None  Venous Stasis Changes No No  Distal Circulation Normal Normal  Neurologic    Light Touch Sensation Intact Intact  Special Tests: Positive McMurray on the left, medial joint line tenderness     Imaging:   Xray (4 view right knee): There is mild medial joint space narrowing but otherwise normal  MRI right knee: Medial joint space narrowing and cartilage loss with otherwise well-preserved lateral patellofemoral spaces and ACL, PCL.  I personally reviewed and interpreted the  radiographs.   Assessment:   72 y.o. male with a right knee medial joint line tenderness in the setting of predominantly isolated medial compartment osteoarthritis.  I did discuss treatment options with him at today's visit.  He has extensively tried strengthening of the right knee.  He has trialed injections and PRP.  At this point he is very disappointed as he has not been able to jog or stay active due to the medial based right knee pain.  Given this we did discuss additional treatment options.  I did discuss specifically partial knee replacement.  I discussed that he may benefit from this.  We did discuss the possibility of need for future surgery or revision of partial knee replacement.  He understands this.  He would like to consider all of his options.  At this point he is leaning towards right knee partial knee replacement.  He will send [place] a MyChart message should he want to proceed with this Plan :    -He will send [place] a MyChart message with further decision     I personally saw and evaluated the patient, and participated in the management and treatment plan.  Vanetta Mulders, MD Attending Physician, Orthopedic Surgery  This document was dictated using Dragon voice recognition software. A reasonable attempt at proof reading has been made to minimize errors.

## 2022-02-14 ENCOUNTER — Encounter (HOSPITAL_BASED_OUTPATIENT_CLINIC_OR_DEPARTMENT_OTHER): Payer: Self-pay | Admitting: Orthopaedic Surgery

## 2022-02-16 NOTE — Telephone Encounter (Signed)
I spoke with patient, and advised we need clearance from his GP before surgery can be scheduled. Clearance request was sent over to GP this am. Will call patient to schedule surgery once clearance has been received. Patient understood.

## 2022-10-31 IMAGING — DX DG KNEE COMPLETE 4+V*R*
4 series · 4 of 4 positions shown · non-contrast
Comparison: None Available.

CLINICAL DATA: Chronic right knee pain.

EXAM:
RIGHT KNEE - COMPLETE 4+ VIEW

[knee tunnel]
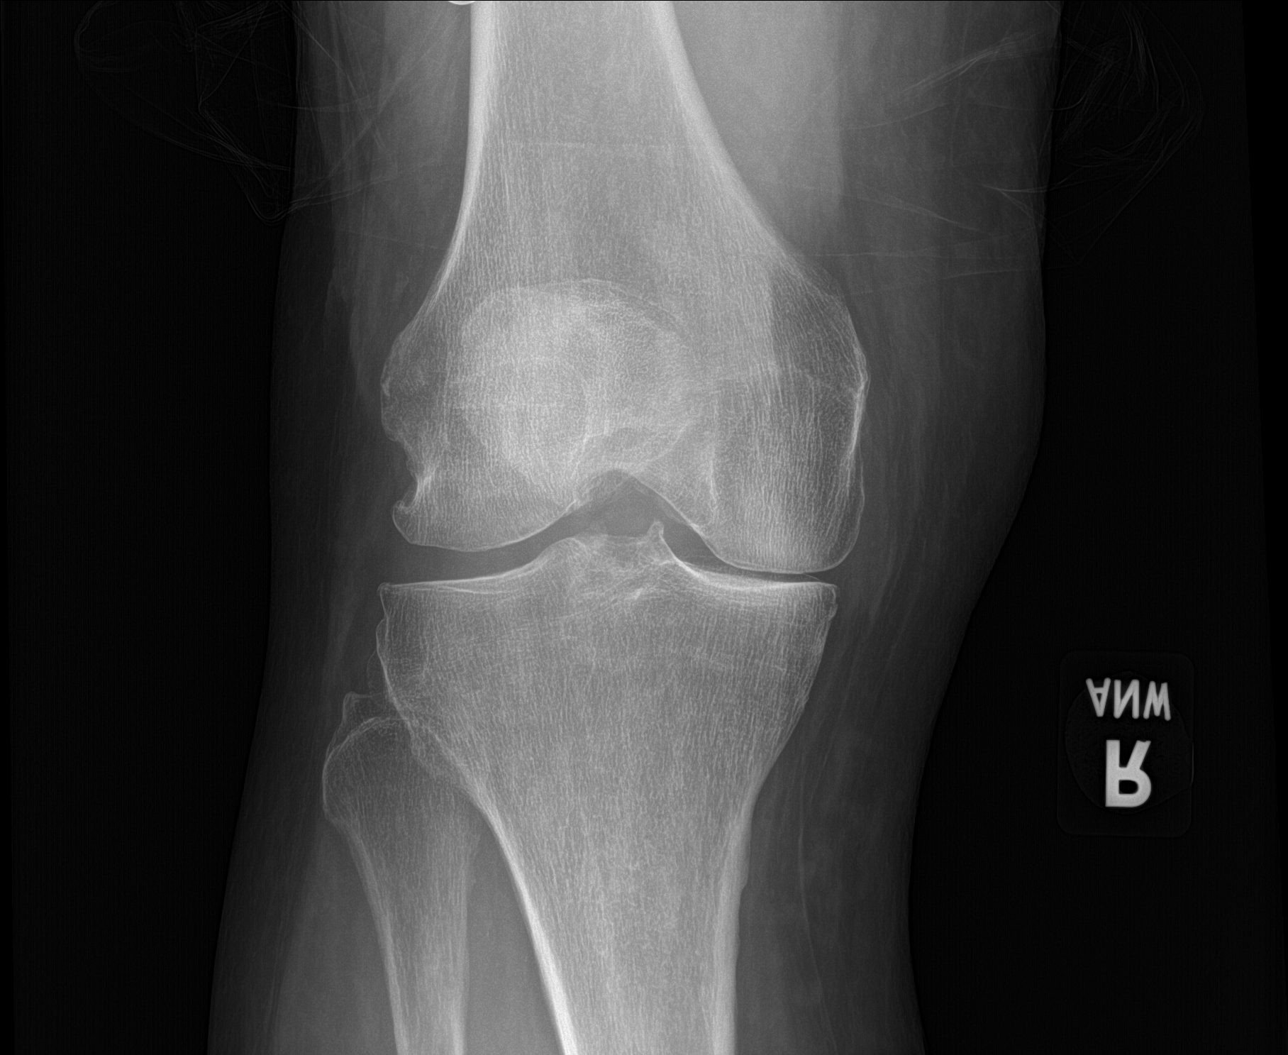

[patella sunrise]
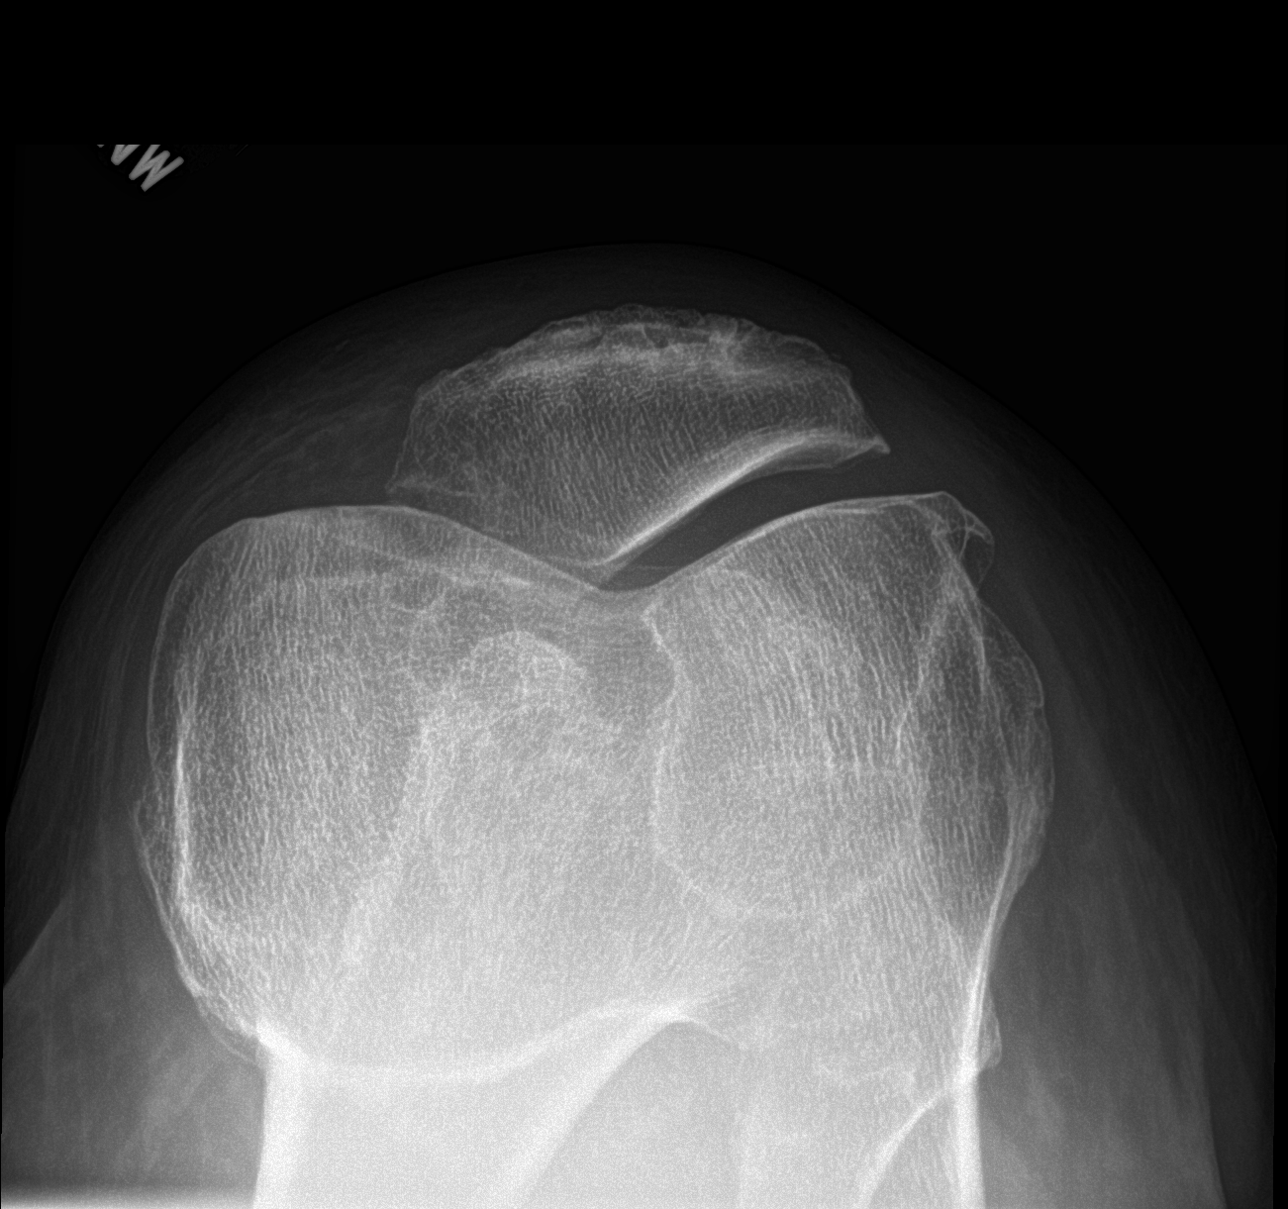

[knee ap]
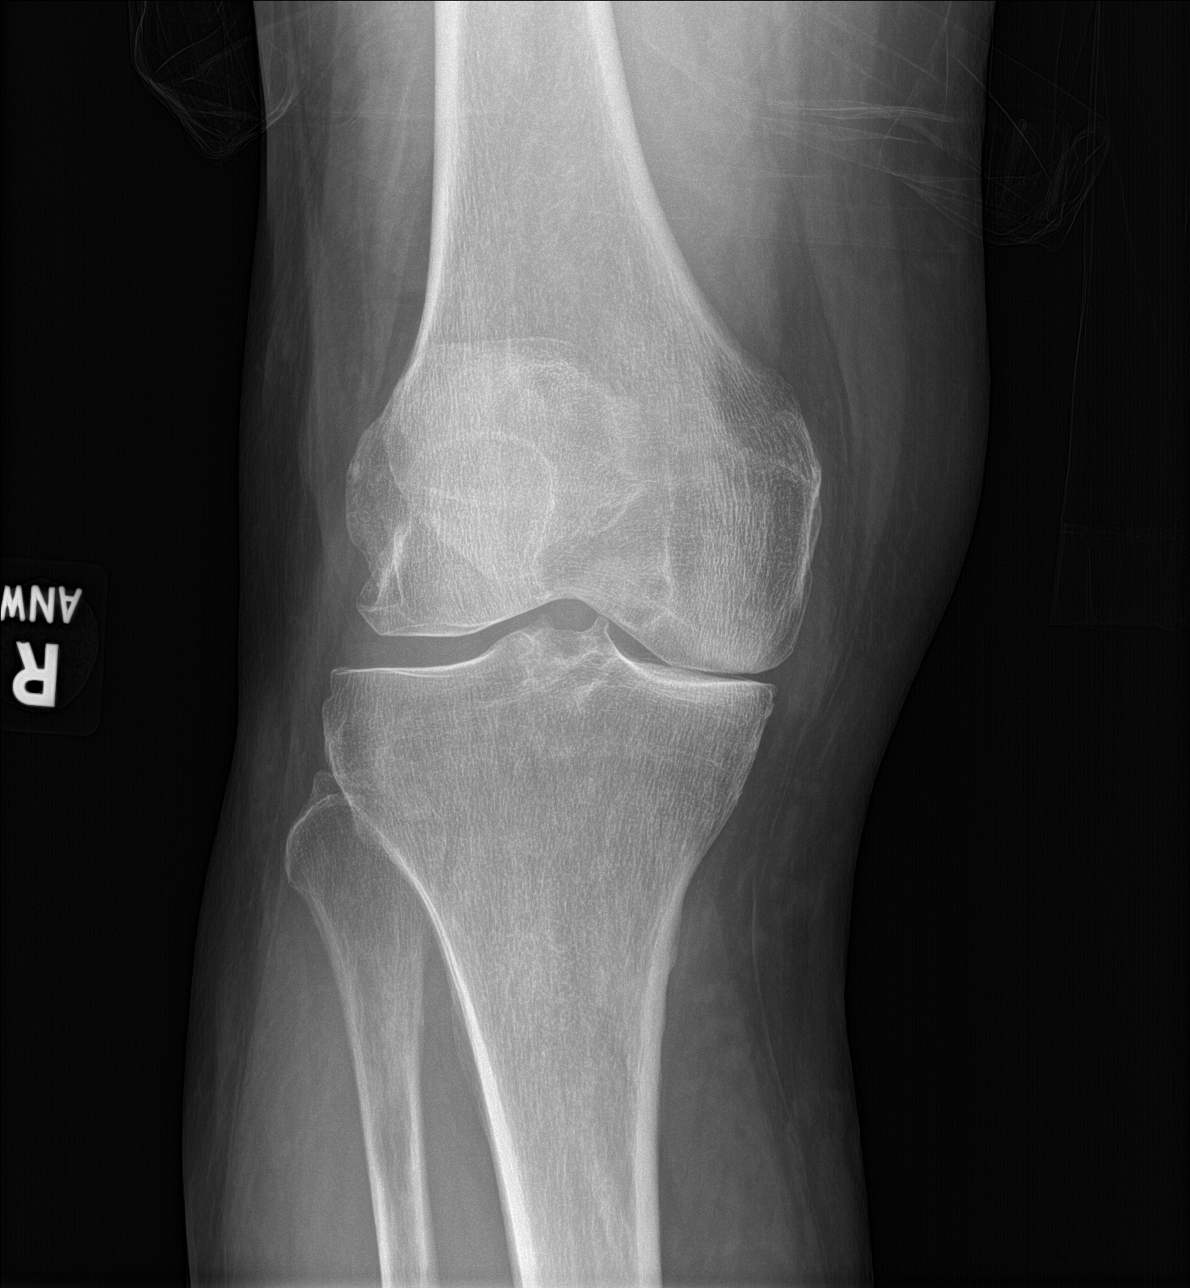

[knee lat]
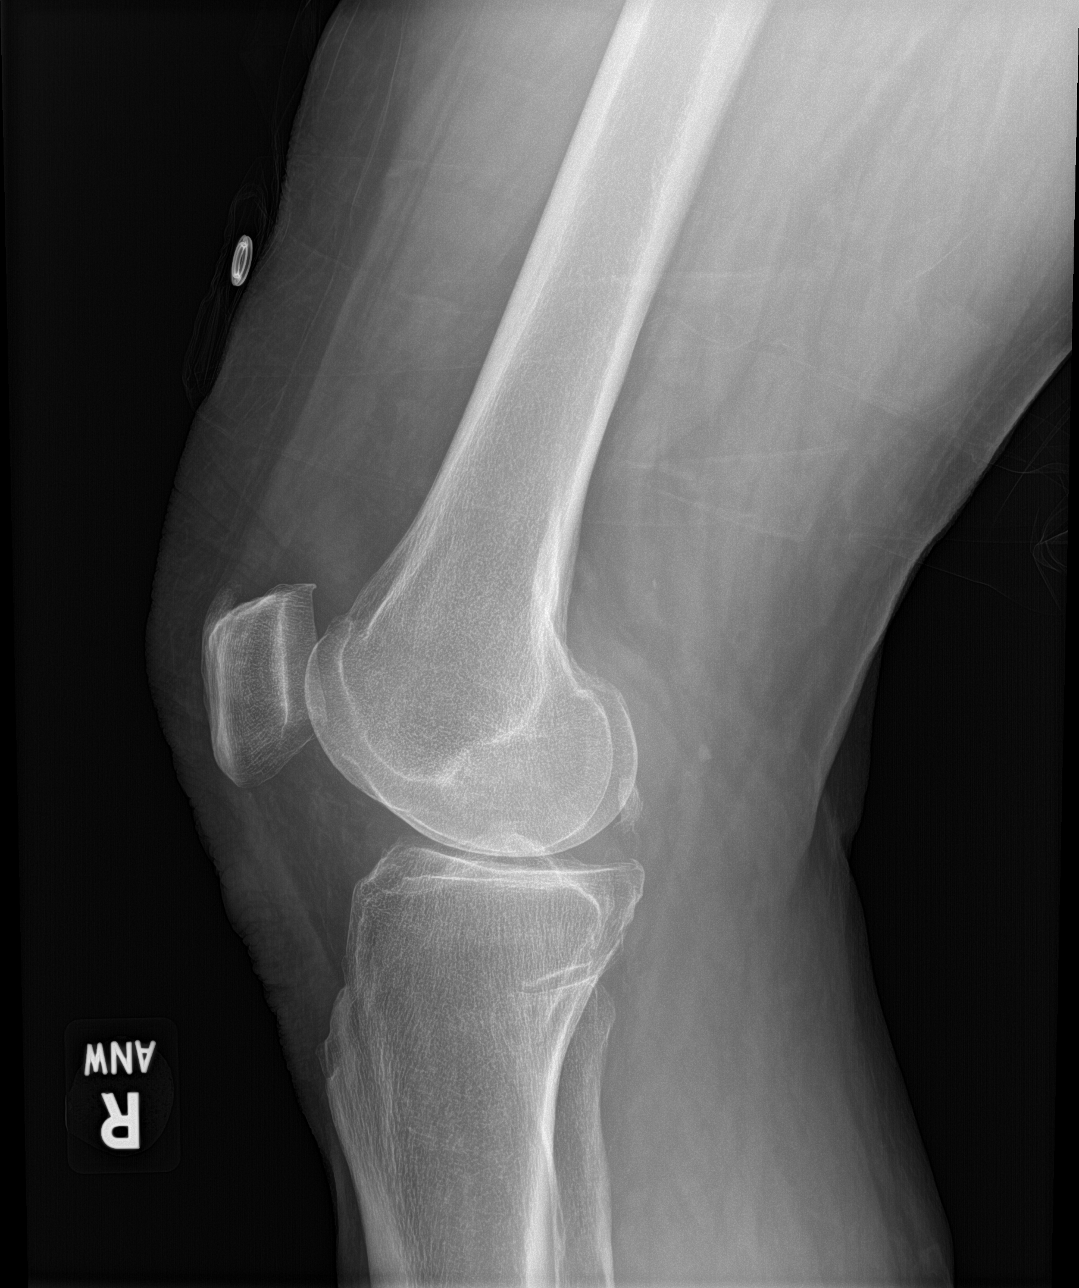

[4 of 4 positions shown; findings below may reference images not displayed]

FINDINGS: No evidence of fracture, dislocation. Minimal suprapatellar
effusion. Mild tibial spine osteophytosis and narrowing of the
medial femoral tibial joint space noted. Soft tissues are
unremarkable.
IMPRESSION: Mild degenerative joint changes of right knee.
